# Patient Record
Sex: Male | Born: 1990 | Hispanic: Yes | Marital: Single | Smoking: Never smoker
Health system: Southern US, Community
[De-identification: ages and names within clinical notes are randomized; demographics above are authoritative.]

## PROBLEM LIST (undated history)

## (undated) DIAGNOSIS — R945 Abnormal results of liver function studies: Secondary | ICD-10-CM

## (undated) DIAGNOSIS — E119 Type 2 diabetes mellitus without complications: Secondary | ICD-10-CM

## (undated) DIAGNOSIS — K760 Fatty (change of) liver, not elsewhere classified: Secondary | ICD-10-CM

## (undated) HISTORY — DX: Abnormal results of liver function studies: R94.5

## (undated) HISTORY — DX: Type 2 diabetes mellitus without complications: E11.9

## (undated) HISTORY — PX: NO PAST SURGERIES: SHX2092

---
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
## 2018-11-13 ENCOUNTER — Ambulatory Visit (INDEPENDENT_AMBULATORY_CARE_PROVIDER_SITE_OTHER): Payer: BLUE CROSS/BLUE SHIELD | Admitting: Family Medicine

## 2018-11-13 ENCOUNTER — Encounter: Payer: Self-pay | Admitting: Family Medicine

## 2018-11-13 VITALS — BP 120/72 | HR 84 | Temp 98.4°F | Ht 67.0 in | Wt 175.6 lb

## 2018-11-13 DIAGNOSIS — Z7185 Encounter for immunization safety counseling: Secondary | ICD-10-CM

## 2018-11-13 DIAGNOSIS — Z113 Encounter for screening for infections with a predominantly sexual mode of transmission: Secondary | ICD-10-CM

## 2018-11-13 DIAGNOSIS — Z13220 Encounter for screening for lipoid disorders: Secondary | ICD-10-CM

## 2018-11-13 DIAGNOSIS — Z23 Encounter for immunization: Secondary | ICD-10-CM | POA: Diagnosis not present

## 2018-11-13 DIAGNOSIS — Z0000 Encounter for general adult medical examination without abnormal findings: Secondary | ICD-10-CM

## 2018-11-13 DIAGNOSIS — Z7189 Other specified counseling: Secondary | ICD-10-CM

## 2018-11-13 NOTE — Progress Notes (Signed)
Subjective:    Patient ID: Sean Gonzalez, male    DOB: 07/28/1991, 28 y.o.   MRN: [id number]  HPI Chief Complaint  Patient presents with  . Annual Exam   He is new to the practice and here for a complete physical exam. Previous medical care: No PCP. Goes to urgent care.  Last CPE:  5-6 years ago.   Other providers:  None   Social history: Lives with his parents. In a relationship with a male partner. No children. 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  Denies smoking, drinking alcohol, drug use Diet: fairly healthy  Exercise: 2 days per week.   Requests STD testing today.  Immunizations: flu shot- refuses. Tdap unknown.  Denies having HPV vaccine but would like to do some research about this.  Health maintenance:  Colonoscopy: N/A Last PSA: N/A Last Dental Exam: twice annually  Last Eye Exam: 4 months ago   Wears seatbelt always, smoke detectors in home and functioning, does not text while driving, feels safe in home environment.  Reviewed allergies, medications, past medical, surgical, family, and social history.    Review of Systems Review of Systems Constitutional: -fever, -chills, -sweats, -unexpected weight change,-fatigue ENT: -runny nose, -ear pain, -sore throat Cardiology:  -chest pain, -palpitations, -edema Respiratory: -cough, -shortness of breath, -wheezing Gastroenterology: -abdominal pain, -nausea, -vomiting, -diarrhea, -constipation  Hematology: -bleeding or bruising problems Musculoskeletal: -arthralgias, -myalgias, -joint swelling, -back pain Ophthalmology: -vision changes Urology: -dysuria, -difficulty urinating, -hematuria, -urinary frequency, -urgency Neurology: -headache, -weakness, -tingling, -numbness       Objective:   Physical Exam  BP 120/72   Pulse 84   Temp 98.4 F (36.9 C) (Oral)   Ht 5\' 7"  (1.702 m)   Wt 175 lb 9.6 oz (79.7 kg)   SpO2 98%   BMI 27.50 kg/m   General Appearance:    Alert, cooperative, no  distress, appears stated age  Head:    Normocephalic, without obvious abnormality, atraumatic  Eyes:    PERRL, conjunctiva/corneas clear, EOM's intact, fundi    benign  Ears:    Normal TM's and external ear canals  Nose:   Nares normal, mucosa normal, no drainage or sinus   tenderness  Throat:   Lips, mucosa, and tongue normal; teeth and gums normal  Neck:   Supple, no lymphadenopathy;  thyroid:  no   enlargement/tenderness/nodules; no carotid   bruit or JVD  Back:    Spine nontender, no curvature, ROM normal, no CVA     tenderness  Lungs:     Clear to auscultation bilaterally without wheezes, rales or     ronchi; respirations unlabored  Chest Wall:    No tenderness or deformity   Heart:    Regular rate and rhythm, S1 and S2 normal, no murmur, rub   or gallop  Breast Exam:    No chest wall tenderness, masses or gynecomastia  Abdomen:     Soft, non-tender, nondistended, normoactive bowel sounds,    no masses, no hepatosplenomegaly  Genitalia:    Normal male external genitalia without lesions.  Testicles without masses.  No inguinal hernias.  Rectal:   Deferred due to age <40 and lack of symptoms  Extremities:   No clubbing, cyanosis or edema  Pulses:   2+ and symmetric all extremities  Skin:   Skin color, texture, turgor normal, no rashes or lesions  Lymph nodes:   Cervical, supraclavicular, and axillary nodes normal  Neurologic:   CNII-XII intact, normal strength, sensation and gait;  reflexes 2+ and symmetric throughout          Psych:   Normal mood, affect, hygiene and grooming.     Urinalysis dipstick: Unable to leave specimen      Assessment & Plan:  Routine general medical examination at a health care facility - Plan: CBC with Differential/Platelet, Comprehensive metabolic panel, Lipid panel, CANCELED: POCT Urinalysis DIP (Proadvantage Device)  Need for diphtheria-tetanus-pertussis (Tdap) vaccine - Plan: Tdap vaccine greater than or equal to 7yo IM  Immunization counseling -  Plan: Tdap vaccine greater than or equal to 7yo IM  Screening for lipid disorders - Plan: Lipid panel  Screen for STD (sexually transmitted disease) - Plan: HIV Antibody (routine testing w rflx), RPR, CANCELED: GC/Chlamydia Probe Amp  He is a pleasant 28 year old male who is new to the practice and here today for a fasting CPE. Appears to be taking good care of himself. Counseling on healthy diet and exercise for continued health. Immunization counseling was done.  He will check on the HPV vaccine and let us know if he decides to get this.  He declines flu shot.  Tdap was given and counseling done on all components of the vaccine. Discussed safety and health promotion Recommend monthly self testicular exams. STD screening done per patient request.  He was unable to leave a urine specimen so we will have to cancel the GC/CT. Follow-up pending lab results.

## 2018-11-13 NOTE — Patient Instructions (Signed)
It was a pleasure meeting you today.  You received your Tdap (tetanus, diphtheria, pertussis) vaccine today and this is good for 10 years. You can call and check with your insurance company regarding the HPV vaccine.  This is a 3 shot series over 6 months.  If you decide to get this you can call and schedule a nurse visit.  Make sure you are eating a well-balanced, healthy diet and getting at least 150 minutes of physical activity per week.  As discussed, make sure you are doing monthly testicular exams.  We will call you with your lab results.    Preventive Care 18-39 Years, Male Preventive care refers to lifestyle choices and visits with your health care provider that can promote health and wellness. What does preventive care include?   A yearly physical exam. This is also called an annual well check.  Dental exams once or twice a year.  Routine eye exams. Ask your health care provider how often you should have your eyes checked.  Personal lifestyle choices, including: ? Daily care of your teeth and gums. ? Regular physical activity. ? Eating a healthy diet. ? Avoiding tobacco and drug use. ? Limiting alcohol use. ? Practicing safe sex. What happens during an annual well check? The services and screenings done by your health care provider during your annual well check will depend on your age, overall health, lifestyle risk factors, and family history of disease. Counseling Your health care provider may ask you questions about your:  Alcohol use.  Tobacco use.  Drug use.  Emotional well-being.  Home and relationship well-being.  Sexual activity.  Eating habits.  Work and work Statistician. Screening You may have the following tests or measurements:  Height, weight, and BMI.  Blood pressure.  Lipid and cholesterol levels. These may be checked every 5 years starting at age 46.  Diabetes screening. This is done by checking your blood sugar (glucose) after you  have not eaten for a while (fasting).  Skin check.  Hepatitis C blood test.  Hepatitis B blood test.  Sexually transmitted disease (STD) testing. Discuss your test results, treatment options, and if necessary, the need for more tests with your health care provider. Vaccines Your health care provider may recommend certain vaccines, such as:  Influenza vaccine. This is recommended every year.  Tetanus, diphtheria, and acellular pertussis (Tdap, Td) vaccine. You may need a Td booster every 10 years.  Varicella vaccine. You may need this if you have not been vaccinated.  HPV vaccine. If you are 97 or younger, you may need three doses over 6 months.  Measles, mumps, and rubella (MMR) vaccine. You may need at least one dose of MMR.You may also need a second dose.  Pneumococcal 13-valent conjugate (PCV13) vaccine. You may need this if you have certain conditions and have not been vaccinated.  Pneumococcal polysaccharide (PPSV23) vaccine. You may need one or two doses if you smoke cigarettes or if you have certain conditions.  Meningococcal vaccine. One dose is recommended if you are age 57-21 years and a first-year college student living in a residence hall, or if you have one of several medical conditions. You may also need additional booster doses.  Hepatitis A vaccine. You may need this if you have certain conditions or if you travel or work in places where you may be exposed to hepatitis A.  Hepatitis B vaccine. You may need this if you have certain conditions or if you travel or work in places where  you may be exposed to hepatitis B.  Haemophilus influenzae type b (Hib) vaccine. You may need this if you have certain risk factors. Talk to your health care provider about which screenings and vaccines you need and how often you need them. This information is not intended to replace advice given to you by your health care provider. Make sure you discuss any questions you have with your  health care provider. Document Released: 12/14/2001 Document Revised: 05/31/2017 Document Reviewed: 08/19/2015 Elsevier Interactive Patient Education  2019 [employer].

## 2018-11-14 LAB — LIPID PANEL
Chol/HDL Ratio: 5.1 ratio — ABNORMAL HIGH (ref 0.0–5.0)
Cholesterol, Total: 295 mg/dL — ABNORMAL HIGH (ref 100–199)
HDL: 58 mg/dL (ref >39)
LDL Calculated: 178 mg/dL — ABNORMAL HIGH (ref 0–99)
Triglycerides: 297 mg/dL — ABNORMAL HIGH (ref 0–149)
VLDL Cholesterol Cal: 59 mg/dL — ABNORMAL HIGH (ref 5–40)

## 2018-11-14 LAB — CBC WITH DIFFERENTIAL/PLATELET
Basophils Absolute: 0.1 10*3/uL (ref 0.0–0.2)
Basos: 1 %
EOS (ABSOLUTE): 0.1 10*3/uL (ref 0.0–0.4)
Eos: 2 %
Hematocrit: 48.5 % (ref 37.5–51.0)
Hemoglobin: 17.4 g/dL (ref 13.0–17.7)
Immature Grans (Abs): 0 10*3/uL (ref 0.0–0.1)
Immature Granulocytes: 1 %
LYMPHS ABS: 2.2 10*3/uL (ref 0.7–3.1)
Lymphs: 35 %
MCH: 31.8 pg (ref 26.6–33.0)
MCHC: 35.9 g/dL — ABNORMAL HIGH (ref 31.5–35.7)
MCV: 89 fL (ref 79–97)
Monocytes Absolute: 0.4 10*3/uL (ref 0.1–0.9)
Monocytes: 7 %
Neutrophils Absolute: 3.5 10*3/uL (ref 1.4–7.0)
Neutrophils: 54 %
PLATELETS: 226 10*3/uL (ref 150–450)
RBC: 5.48 x10E6/uL (ref 4.14–5.80)
RDW: 12.9 % (ref 11.6–15.4)
WBC: 6.4 10*3/uL (ref 3.4–10.8)

## 2018-11-14 LAB — COMPREHENSIVE METABOLIC PANEL
ALT: 179 IU/L — AB (ref 0–44)
AST: 70 IU/L — ABNORMAL HIGH (ref 0–40)
Albumin/Globulin Ratio: 1.8 (ref 1.2–2.2)
Albumin: 4.9 g/dL (ref 3.5–5.5)
Alkaline Phosphatase: 187 IU/L — ABNORMAL HIGH (ref 39–117)
BUN/Creatinine Ratio: 20 (ref 9–20)
BUN: 15 mg/dL (ref 6–20)
Bilirubin Total: 1.6 mg/dL — ABNORMAL HIGH (ref 0.0–1.2)
CO2: 25 mmol/L (ref 20–29)
Calcium: 10 mg/dL (ref 8.7–10.2)
Chloride: 94 mmol/L — ABNORMAL LOW (ref 96–106)
Creatinine, Ser: 0.74 mg/dL — ABNORMAL LOW (ref 0.76–1.27)
GFR calc Af Amer: 146 mL/min/{1.73_m2} (ref >59)
GFR calc non Af Amer: 126 mL/min/{1.73_m2} (ref >59)
Globulin, Total: 2.7 g/dL (ref 1.5–4.5)
Glucose: 328 mg/dL — ABNORMAL HIGH (ref 65–99)
Potassium: 3.9 mmol/L (ref 3.5–5.2)
Sodium: 138 mmol/L (ref 134–144)
Total Protein: 7.6 g/dL (ref 6.0–8.5)

## 2018-11-14 LAB — HIV ANTIBODY (ROUTINE TESTING W REFLEX): HIV Screen 4th Generation wRfx: NONREACTIVE

## 2018-11-14 LAB — RPR: RPR Ser Ql: NONREACTIVE

## 2018-11-21 ENCOUNTER — Encounter: Payer: Self-pay | Admitting: Family Medicine

## 2018-11-21 ENCOUNTER — Ambulatory Visit (INDEPENDENT_AMBULATORY_CARE_PROVIDER_SITE_OTHER): Payer: BLUE CROSS/BLUE SHIELD | Admitting: Family Medicine

## 2018-11-21 VITALS — BP 120/74 | HR 85 | Wt 177.4 lb

## 2018-11-21 DIAGNOSIS — E1169 Type 2 diabetes mellitus with other specified complication: Secondary | ICD-10-CM

## 2018-11-21 DIAGNOSIS — R945 Abnormal results of liver function studies: Secondary | ICD-10-CM

## 2018-11-21 DIAGNOSIS — E119 Type 2 diabetes mellitus without complications: Secondary | ICD-10-CM | POA: Diagnosis not present

## 2018-11-21 DIAGNOSIS — R7309 Other abnormal glucose: Secondary | ICD-10-CM | POA: Diagnosis not present

## 2018-11-21 DIAGNOSIS — R7989 Other specified abnormal findings of blood chemistry: Secondary | ICD-10-CM

## 2018-11-21 DIAGNOSIS — E785 Hyperlipidemia, unspecified: Secondary | ICD-10-CM

## 2018-11-21 LAB — POCT URINALYSIS DIP (PROADVANTAGE DEVICE)
Bilirubin, UA: NEGATIVE
Blood, UA: NEGATIVE
GLUCOSE UA: NEGATIVE mg/dL
Nitrite, UA: NEGATIVE
Protein Ur, POC: NEGATIVE mg/dL
Specific Gravity, Urine: 1.025
Urobilinogen, Ur: NEGATIVE
pH, UA: 6 (ref 5.0–8.0)

## 2018-11-21 LAB — POCT GLYCOSYLATED HEMOGLOBIN (HGB A1C): Hemoglobin A1C: 14 % — AB (ref 4.0–5.6)

## 2018-11-21 MED ORDER — INSULIN GLARGINE (1 UNIT DIAL) 300 UNIT/ML ~~LOC~~ SOPN: 10.0000 [IU] | PEN_INJECTOR | Freq: Every day | SUBCUTANEOUS | 0 refills | Status: DC

## 2018-11-21 MED ORDER — GLUCOSE BLOOD VI STRP: ORAL_STRIP | 2 refills | Status: DC

## 2018-11-21 MED ORDER — ONETOUCH DELICA LANCETS 33G MISC: 1 refills | Status: DC

## 2018-11-21 MED ORDER — METFORMIN HCL 500 MG PO TABS: 500.0000 mg | ORAL_TABLET | Freq: Two times a day (BID) | ORAL | 2 refills | Status: DC

## 2018-11-21 NOTE — Patient Instructions (Addendum)
Go to the American Diabetes Association website to read about diabetes.   Start on the metformin twice daily with meals as prescribed.   Take 10 units of Toujeo (long acting insulin) around the same time each day. This is once daily.   Check your blood sugars every day when you wake up (this is fasting) and then 2 hours after lunch or supper. No other times.   Goal fasting blood sugar is 80-130 and 2 hours after a meal 130-160.  It will take some time to get your blood sugars down to this so do not worry if your blood sugars are not close to goal for the next couple of weeks.   Cut back on carbohydrates (potaotes, rice, pasta, bread) and sugar.  Try to limit your carbohydrates to 45-60 per meal and 15 for a snack.   Avoid alcohol and any over the counter pain medications such as ibuprofen, aleve, aspirin etc.   Get at least 150 minutes of physical activity per week. Start slow and increase your activity.      Diabetes Mellitus and Standards of Medical Care Managing diabetes (diabetes mellitus) can be complicated. Your diabetes treatment may be managed by a team of health care providers, including:  A physician who specializes in diabetes (endocrinologist).  A nurse practitioner or physician assistant.  Nurses.  A diet and nutrition specialist (registered dietitian).  A certified diabetes educator (CDE).  An exercise specialist.  A pharmacist.  An eye doctor.  A foot specialist (podiatrist).  A dentist.  A primary care provider.  A mental health provider. Your health care providers follow guidelines to help you get the best quality of care. The following schedule is a general guideline for your diabetes management plan. Your health care providers may give you more specific instructions. Physical exams Upon being diagnosed with diabetes mellitus, and each year after that, your health care provider will ask about your medical and family history. He or she will also do a  physical exam. Your exam may include:  Measuring your height, weight, and body mass index (BMI).  Checking your blood pressure. This will be done at every routine medical visit. Your target blood pressure may vary depending on your medical conditions, your age, and other factors.  Thyroid gland exam.  Skin exam.  Screening for damage to your nerves (peripheral neuropathy). This may include checking the pulse in your legs and feet and checking the level of sensation in your hands and feet.  A complete foot exam to inspect the structure and skin of your feet, including checking for cuts, bruises, redness, blisters, sores, or other problems.  Screening for blood vessel (vascular) problems, which may include checking the pulse in your legs and feet and checking your temperature. Blood tests Depending on your treatment plan and your personal needs, you may have the following tests done:  HbA1c (hemoglobin A1c). This test provides information about blood sugar (glucose) control over the previous 2-3 months. It is used to adjust your treatment plan, if needed. This test will be done: ? At least 2 times a year, if you are meeting your treatment goals. ? 4 times a year, if you are not meeting your treatment goals or if treatment goals have changed.  Lipid testing, including total, LDL, and HDL cholesterol and triglyceride levels. ? The goal for LDL is less than 100 mg/dL (5.5 mmol/L). If you are at high risk for complications, the goal is less than 70 mg/dL (3.9 mmol/L). ? The  goal for HDL is 40 mg/dL (2.2 mmol/L) or higher for men and 50 mg/dL (2.8 mmol/L) or higher for women. An HDL cholesterol of 60 mg/dL (3.3 mmol/L) or higher gives some protection against heart disease. ? The goal for triglycerides is less than 150 mg/dL (8.3 mmol/L).  Liver function tests.  Kidney function tests.  Thyroid function tests. Dental and eye exams  Visit your dentist two times a year.  If you have type 1  diabetes, your health care provider may recommend an eye exam 3-5 years after you are diagnosed, and then once a year after your first exam. ? For children with type 1 diabetes, a health care provider may recommend an eye exam when your child is age 33 or older and has had diabetes for 3-5 years. After the first exam, your child should get an eye exam once a year.  If you have type 2 diabetes, your health care provider may recommend an eye exam as soon as you are diagnosed, and then once a year after your first exam. Immunizations   The yearly flu (influenza) vaccine is recommended for everyone 6 months or older who has diabetes.  The pneumonia (pneumococcal) vaccine is recommended for everyone 2 years or older who has diabetes. If you are 69 or older, you may get the pneumonia vaccine as a series of two separate shots.  The hepatitis B vaccine is recommended for adults shortly after being diagnosed with diabetes.  Adults and children with diabetes should receive all other vaccines according to age-specific recommendations from the Centers for Disease Control and Prevention (CDC). Mental and emotional health Screening for symptoms of eating disorders, anxiety, and depression is recommended at the time of diagnosis and afterward as needed. If your screening shows that you have symptoms (positive screening result), you may need more evaluation and you may work with a mental health care provider. Treatment plan Your treatment plan will be reviewed at every medical visit. You and your health care provider will discuss:  How you are taking your medicines, including insulin.  Any side effects you are experiencing.  Your blood glucose target goals.  The frequency of your blood glucose monitoring.  Lifestyle habits, such as activity level as well as tobacco, alcohol, and substance use. Diabetes self-management education Your health care provider will assess how well you are monitoring your blood  glucose levels and whether you are taking your insulin correctly. He or she may refer you to:  A certified diabetes educator to manage your diabetes throughout your life, starting at diagnosis.  A registered dietitian who can create or review your personal nutrition plan.  An exercise specialist who can discuss your activity level and exercise plan. Summary  Managing diabetes (diabetes mellitus) can be complicated. Your diabetes treatment may be managed by a team of health care providers.  Your health care providers follow guidelines in order to help you get the best quality of care.  Standards of care including having regular physical exams, blood tests, blood pressure monitoring, immunizations, screening tests, and education about how to manage your diabetes.  Your health care providers may also give you more specific instructions based on your individual health. This information is not intended to replace advice given to you by your health care provider. Make sure you discuss any questions you have with your health care provider. Document Released: 08/15/2009 Document Revised: 07/07/2018 Document Reviewed: 07/16/2016 Elsevier Interactive Patient Education  2019 [employer].

## 2018-11-21 NOTE — Progress Notes (Signed)
   Subjective:    Patient ID: Sean Gonzalez, male    DOB: 09-03-1991, 28 y.o.   MRN: [id number]  HPI Chief Complaint  Patient presents with  . discuss labs    discuss labs   He is here to follow up on abnormal labs.  In his previous visit he was here for a new patient physical.  His fasting glucose was greater than 300 with no known history of diabetes. States that prior to our last visit he had not had medical care in at least 8 years.  He does have a family history of diabetes in his mother.   Complains of urinary frequency, increased thirst when specifically asked however he declined any symptoms at his visit last week.  He also had significantly elevated liver function tests.  Diet is high in carbohydrates. Eats fast food several times per week Exercising once a week.   Reports he rarely drinks alcohol or uses over-the-counter anti-inflammatories.  Elevated LDL.  Denies fever, chills, night sweats, dizziness, unexplained weight changes, chest pain, palpitations, shortness of breath, cough, abdominal pain, nausea, vomiting, diarrhea.    Review of Systems Pertinent positives and negatives in the history of present illness.     Objective:   Physical Exam BP 120/74   Pulse 85   Wt 177 lb 6.4 oz (80.5 kg)   BMI 27.78 kg/m   Alert and oriented and in no acute distress.  Not otherwise examined.      Assessment & Plan:  Diabetes mellitus, new onset (HCC) - Plan: CBC with Differential/Platelet, Comprehensive metabolic panel, TSH, T4, free, T3, Insulin and C-Peptide, metFORMIN (GLUCOPHAGE) 500 MG tablet, Insulin Glargine, 1 Unit Dial, (TOUJEO SOLOSTAR) 300 UNIT/ML SOPN, POCT Urinalysis DIP (Proadvantage Device)  Elevated glucose - Plan: HgB A1c  Elevated liver function tests - Plan: Comprehensive metabolic panel, Protime-INR, Acute Hep Panel & Hep B Surface Ab  Hyperlipidemia associated with type 2 diabetes mellitus (HCC)  Hemoglobin A1c 14%.  In-depth  counseling on diabetes diagnosis, diet and lifestyle modifications to help control diabetes.  He was provided with a meter and testing supplies and instructed on checking his blood sugars.  I encouraged him to do this twice daily for the next week until I see him back. We will start him on metformin twice daily and a long-acting insulin.  Samples of Toujeo were available so we started with 10 units and 2 pens were provided to the patient.  He was taught how to give himself these injections and the first dose was given in the office.  He will do these once daily around the same time. Encouraged him to go to the ADA website to read about standards of care for diabetes. He does appear to be overwhelmed today and he is tearful.  He lives with his parents and has a good support network. Discussed that I am also concerned about his liver function tests and we will repeat these today along with adding an acute hepatitis panel.  Advised him to avoid alcohol, NSAIDs.  If his liver function tests are not much improved, he is aware that I will order an ultrasound to look at his liver.  I will also refer him to GI at that point. Plan to bring him back next Monday to see how he is doing, answer any questions and at that point we will need to discuss adding a statin.

## 2018-11-22 ENCOUNTER — Other Ambulatory Visit: Payer: Self-pay | Admitting: Internal Medicine

## 2018-11-22 DIAGNOSIS — R748 Abnormal levels of other serum enzymes: Secondary | ICD-10-CM

## 2018-11-22 LAB — TSH: TSH: 1.2 u[IU]/mL (ref 0.450–4.500)

## 2018-11-22 LAB — CBC WITH DIFFERENTIAL/PLATELET
Basophils Absolute: 0.1 10*3/uL (ref 0.0–0.2)
Basos: 1 %
EOS (ABSOLUTE): 0.1 10*3/uL (ref 0.0–0.4)
Eos: 2 %
Hematocrit: 47 % (ref 37.5–51.0)
Hemoglobin: 16.6 g/dL (ref 13.0–17.7)
Immature Grans (Abs): 0.1 10*3/uL (ref 0.0–0.1)
Immature Granulocytes: 1 %
Lymphocytes Absolute: 2.4 10*3/uL (ref 0.7–3.1)
Lymphs: 32 %
MCH: 30.5 pg (ref 26.6–33.0)
MCHC: 35.3 g/dL (ref 31.5–35.7)
MCV: 86 fL (ref 79–97)
Monocytes Absolute: 0.4 10*3/uL (ref 0.1–0.9)
Monocytes: 6 %
NEUTROS ABS: 4.3 10*3/uL (ref 1.4–7.0)
Neutrophils: 58 %
PLATELETS: 247 10*3/uL (ref 150–450)
RBC: 5.44 x10E6/uL (ref 4.14–5.80)
RDW: 12.9 % (ref 11.6–15.4)
WBC: 7.3 10*3/uL (ref 3.4–10.8)

## 2018-11-22 LAB — COMPREHENSIVE METABOLIC PANEL
ALT: 135 IU/L — AB (ref 0–44)
AST: 42 IU/L — ABNORMAL HIGH (ref 0–40)
Albumin/Globulin Ratio: 1.7 (ref 1.2–2.2)
Albumin: 4.6 g/dL (ref 4.1–5.2)
Alkaline Phosphatase: 202 IU/L — ABNORMAL HIGH (ref 39–117)
BUN/Creatinine Ratio: 20 (ref 9–20)
BUN: 13 mg/dL (ref 6–20)
Bilirubin Total: 0.8 mg/dL (ref 0.0–1.2)
CO2: 24 mmol/L (ref 20–29)
Calcium: 9.6 mg/dL (ref 8.7–10.2)
Chloride: 95 mmol/L — ABNORMAL LOW (ref 96–106)
Creatinine, Ser: 0.65 mg/dL — ABNORMAL LOW (ref 0.76–1.27)
GFR calc Af Amer: 154 mL/min/{1.73_m2} (ref >59)
GFR calc non Af Amer: 133 mL/min/{1.73_m2} (ref >59)
GLUCOSE: 323 mg/dL — AB (ref 65–99)
Globulin, Total: 2.7 g/dL (ref 1.5–4.5)
Potassium: 4.3 mmol/L (ref 3.5–5.2)
Sodium: 138 mmol/L (ref 134–144)
Total Protein: 7.3 g/dL (ref 6.0–8.5)

## 2018-11-22 LAB — INSULIN AND C-PEPTIDE, SERUM
C-Peptide: 2.3 ng/mL (ref 1.1–4.4)
INSULIN: 14.8 u[IU]/mL (ref 2.6–24.9)

## 2018-11-22 LAB — T4, FREE: Free T4: 1.35 ng/dL (ref 0.82–1.77)

## 2018-11-22 LAB — PROTIME-INR
INR: 1 (ref 0.8–1.2)
Prothrombin Time: 10.4 s (ref 9.1–12.0)

## 2018-11-22 LAB — T3: T3, Total: 106 ng/dL (ref 71–180)

## 2018-11-23 LAB — ACUTE HEP PANEL AND HEP B SURFACE AB
HEP A IGM: NEGATIVE
Hep B C IgM: NEGATIVE
Hep C Virus Ab: <0.1 s/co ratio (ref 0.0–0.9)
Hepatitis B Surf Ab Quant: 146.6 m[IU]/mL (ref >9.9)
Hepatitis B Surface Ag: NEGATIVE

## 2018-11-27 ENCOUNTER — Encounter: Payer: Self-pay | Admitting: Family Medicine

## 2018-11-27 ENCOUNTER — Other Ambulatory Visit: Payer: Self-pay

## 2018-11-27 ENCOUNTER — Encounter: Payer: Self-pay | Admitting: Physician Assistant

## 2018-11-27 ENCOUNTER — Ambulatory Visit (INDEPENDENT_AMBULATORY_CARE_PROVIDER_SITE_OTHER): Payer: BLUE CROSS/BLUE SHIELD | Admitting: Family Medicine

## 2018-11-27 VITALS — BP 124/88 | HR 88 | Wt 176.6 lb

## 2018-11-27 DIAGNOSIS — R7989 Other specified abnormal findings of blood chemistry: Secondary | ICD-10-CM

## 2018-11-27 DIAGNOSIS — R822 Biliuria: Secondary | ICD-10-CM

## 2018-11-27 DIAGNOSIS — E119 Type 2 diabetes mellitus without complications: Secondary | ICD-10-CM

## 2018-11-27 DIAGNOSIS — R945 Abnormal results of liver function studies: Secondary | ICD-10-CM

## 2018-11-27 HISTORY — DX: Type 2 diabetes mellitus without complications: E11.9

## 2018-11-27 HISTORY — DX: Other specified abnormal findings of blood chemistry: R79.89

## 2018-11-27 MED ORDER — DAPAGLIFLOZIN PROPANEDIOL 5 MG PO TABS: 5.0000 mg | ORAL_TABLET | Freq: Every day | ORAL | 2 refills | Status: DC

## 2018-11-27 MED ORDER — INSULIN GLARGINE (1 UNIT DIAL) 300 UNIT/ML ~~LOC~~ SOPN: 14.0000 [IU] | PEN_INJECTOR | Freq: Every day | SUBCUTANEOUS | 0 refills | Status: DC

## 2018-11-27 NOTE — Patient Instructions (Signed)
We are increasing your Toujeo insulin dose to 14 units daily.   Take the [place] as prescribed once daily.   Continue on the Metformin for now and let's see how you do. If your symptoms are not improving then let me know.   I am referring you to the medical nutritionist and they will call you to schedule.   Go for your abdominal US as scheduled. I am also referring you to the gastroenterologist. They will call you to schedule.   Check your blood sugars as discussed twice daily for the next 2 weeks and return to see me.

## 2018-11-27 NOTE — Progress Notes (Signed)
   Subjective:    Patient ID: Sean Gonzalez, male    DOB: 02-05-1991, 28 y.o.   MRN: [id number]  HPI Chief Complaint  Patient presents with  . new    diabetes follow-up, metformin- causing headaches and diarrhea   He is here to follow up on new diabetes diagnosis. His girlfriend who is a Associate Professor is with him today.   States he feels fine. No longer having increased thirst or urinary frequency.   States he could not figure out how to correctly check his BS at home. States he has not been reading about diabetes. States he is still a little overwhelmed and hopes to be able to stop diabetes medication in the future.   Reports taking Toujeo 10 units daily at 11 am and Metformin 500 mg twice daily at 1 pm and 7 pm due to his work schedule.  Reports working 3-12 pm.  Complains of mild headache and diarrhea.   States he has made healthy diet changes but still eat rice and bread.  States in the morning he is now having a grilled chicken sandwich and for supper grilled chicken and veggies.  Does not eat breakfast.    Liver US is this Thursday due to elevated LFTs. Denies alcohol, drug or NSAID use.   Denies fever, chills, dizziness, chest pain, palpitations, shortness of breath, abdominal pain, N/V, urinary symptoms, LE edema.   Reviewed allergies, medications, past medical, surgical, family, and social history.   Review of Systems Pertinent positives and negatives in the history of present illness.     Objective:   Physical Exam Constitutional:      General: He is not in acute distress.    Appearance: Normal appearance.  Eyes:     Conjunctiva/sclera: Conjunctivae normal.  Skin:    General: Skin is warm and dry.     Coloration: Skin is not jaundiced or pale.     Findings: No rash.  Neurological:     Mental Status: He is alert.    BP 124/88   Pulse 88   Wt 176 lb 9.6 oz (80.1 kg)   BMI 27.66 kg/m       Assessment & Plan:  Diabetes mellitus, new onset (HCC) -  Plan: Comprehensive metabolic panel, Amb ref to Medical Nutrition Therapy-MNT  Elevated liver function tests - Plan: Comprehensive metabolic panel, Ambulatory referral to Gastroenterology  Bilirubin in urine - Plan: POCT Urinalysis DIP (Proadvantage Device)  PCOT 307 - taken with his glucometer in order to teach him how to use this.  Increase Toujeo to 14 units daily.  Add Farxiga 5 mg daily.  Continue on Metformin 500 mg bid. He will let me know if headache and diarrhea worsening or not improving.  Advised to check BS twice daily.  Referral to MNT. Counseling on standards of care of diabetes. He does not want to start on a statin now. Prefers to go slow with treatment. He does admit that he is still somewhat overwhelmed with his diagnosis.  Liver function tests elevated due to unknown etiology. Liver US scheduled for this Thursday per patient. Referral to GI for further evaluation.  States he cannot leave a urine specimen.  Follow up in 2 weeks and bring in his readings.

## 2018-11-28 LAB — COMPREHENSIVE METABOLIC PANEL
ALT: 142 IU/L — ABNORMAL HIGH (ref 0–44)
AST: 47 IU/L — ABNORMAL HIGH (ref 0–40)
Albumin/Globulin Ratio: 1.8 (ref 1.2–2.2)
Albumin: 4.5 g/dL (ref 4.1–5.2)
Alkaline Phosphatase: 183 IU/L — ABNORMAL HIGH (ref 39–117)
BUN/Creatinine Ratio: 21 — ABNORMAL HIGH (ref 9–20)
BUN: 12 mg/dL (ref 6–20)
Bilirubin Total: 0.8 mg/dL (ref 0.0–1.2)
CO2: 22 mmol/L (ref 20–29)
Calcium: 9.6 mg/dL (ref 8.7–10.2)
Chloride: 98 mmol/L (ref 96–106)
Creatinine, Ser: 0.58 mg/dL — ABNORMAL LOW (ref 0.76–1.27)
GFR calc Af Amer: 162 mL/min/{1.73_m2} (ref >59)
GFR calc non Af Amer: 140 mL/min/{1.73_m2} (ref >59)
GLOBULIN, TOTAL: 2.5 g/dL (ref 1.5–4.5)
Glucose: 277 mg/dL — ABNORMAL HIGH (ref 65–99)
POTASSIUM: 4.1 mmol/L (ref 3.5–5.2)
SODIUM: 136 mmol/L (ref 134–144)
Total Protein: 7 g/dL (ref 6.0–8.5)

## 2018-11-30 ENCOUNTER — Ambulatory Visit
Admission: RE | Admit: 2018-11-30 | Discharge: 2018-11-30 | Disposition: A | Discharge status: Home / Self Care | Payer: BLUE CROSS/BLUE SHIELD | Source: Ambulatory Visit | Attending: Family Medicine | Admitting: Family Medicine

## 2018-11-30 ENCOUNTER — Encounter: Payer: Self-pay | Admitting: Internal Medicine

## 2018-11-30 DIAGNOSIS — K760 Fatty (change of) liver, not elsewhere classified: Secondary | ICD-10-CM | POA: Diagnosis not present

## 2018-11-30 DIAGNOSIS — R748 Abnormal levels of other serum enzymes: Secondary | ICD-10-CM

## 2018-12-04 ENCOUNTER — Ambulatory Visit: Payer: BLUE CROSS/BLUE SHIELD | Admitting: Dietician

## 2018-12-04 ENCOUNTER — Encounter: Payer: Self-pay | Admitting: Physician Assistant

## 2018-12-04 ENCOUNTER — Ambulatory Visit (INDEPENDENT_AMBULATORY_CARE_PROVIDER_SITE_OTHER): Payer: BLUE CROSS/BLUE SHIELD | Admitting: Physician Assistant

## 2018-12-04 ENCOUNTER — Other Ambulatory Visit (INDEPENDENT_AMBULATORY_CARE_PROVIDER_SITE_OTHER): Payer: BLUE CROSS/BLUE SHIELD

## 2018-12-04 VITALS — BP 130/78 | HR 84 | Ht 67.0 in | Wt 177.4 lb

## 2018-12-04 DIAGNOSIS — R945 Abnormal results of liver function studies: Secondary | ICD-10-CM

## 2018-12-04 DIAGNOSIS — K760 Fatty (change of) liver, not elsewhere classified: Secondary | ICD-10-CM | POA: Diagnosis not present

## 2018-12-04 DIAGNOSIS — R7989 Other specified abnormal findings of blood chemistry: Secondary | ICD-10-CM

## 2018-12-04 LAB — FERRITIN: Ferritin: 154.7 ng/mL (ref 22.0–322.0)

## 2018-12-04 LAB — IGA: IgA: 308 mg/dL (ref 68–378)

## 2018-12-04 LAB — IBC PANEL
Iron: 79 ug/dL (ref 42–165)
Saturation Ratios: 16.9 % — ABNORMAL LOW (ref 20.0–50.0)
TRANSFERRIN: 333 mg/dL (ref 212.0–360.0)

## 2018-12-04 NOTE — Patient Instructions (Signed)
Your provider has requested that you go to the basement level for lab work before leaving today. Press "B" on the elevator. The lab is located at the first door on the left as you exit the elevator.   Please follow up with Sean Gonzalez in 4 weeks.

## 2018-12-04 NOTE — Progress Notes (Signed)
Chief Complaint: Elevated LFTs  HPI:    [name] is a 28 year old Hispanic male with a past medical history as listed below, who was referred to me by Girtha Rm, NP-C for a complaint of elevated liver function tests.      11/27/2018 CMP with an alk phos of 183 (202 on 11/13/2018), AST 47 (42 on 11/13/2018) and ALT 142 (135 on 11/13/2018).  Prior to this 3 weeks ago T bili elevated at 1.6, alk phos 187, AST 70, ALT 179.   11/21/2018 hep B acute panel today, PT/INR normal.    11/30/2018 right upper quadrant ultrasound with mild fatty infiltration of the liver no other acute findings.    Today, the patient presents clinic and explains that he is feeling well.  He was just diagnosed with diabetes about 3 to 4 weeks ago and started medication around that time.  This is the first time in his life he has ever been told he has elevated liver enzymes.  He denies any family history of liver disease, IV drug use, tattoos, supplement use or extreme use of Tylenol or alcohol.    Denies fever, chills, weight loss, pruritus, jaundice, heartburn, reflux, change in bowel habits or abdominal pain.  Past Medical History:  Diagnosis Date  . Diabetes mellitus, new onset ([place]) 11/27/2018  . Elevated liver function tests 11/27/2018    No past surgical history on file.  Current Outpatient Medications  Medication Sig Dispense Refill  . dapagliflozin propanediol (FARXIGA) 5 MG TABS tablet Take 5 mg by mouth daily. 30 tablet 2  . glucose blood (ONETOUCH VERIO) test strip Test twice a day. Pt uses onetouch verio flex meter 100 each 2  . Insulin Glargine, 1 Unit Dial, (TOUJEO SOLOSTAR) 300 UNIT/ML SOPN Inject 14 Units into the skin daily. 5 pen 0  . metFORMIN (GLUCOPHAGE) 500 MG tablet Take 1 tablet (500 mg total) by mouth 2 (two) times daily with a meal. 60 tablet 2  . ONETOUCH DELICA LANCETS 90W MISC Test twice a day. Pt uses onetouch verio flex meter 100 each 1   No current facility-administered medications  for this visit.     Allergies as of 12/04/2018  . (No Known Allergies)    Family History  Problem Relation Age of Onset  . Diabetes Mother     Social History   Socioeconomic History  . Marital status: Single    Spouse name: Not on file  . Number of children: Not on file  . Years of education: Not on file  . Highest education level: Not on file  Occupational History  . Not on file  Social Needs  . Financial resource strain: Not on file  . Food insecurity:    Worry: Not on file    Inability: Not on file  . Transportation needs:    Medical: Not on file    Non-medical: Not on file  Tobacco Use  . Smoking status: Never Smoker  . Smokeless tobacco: Never Used  Substance and Sexual Activity  . Alcohol use: Not Currently    Frequency: Never  . Drug use: Never  . Sexual activity: Yes  Lifestyle  . Physical activity:    Days per week: Not on file    Minutes per session: Not on file  . Stress: Not on file  Relationships  . Social connections:    Talks on phone: Not on file    Gets together: Not on file    Attends religious service: Not on  file    Active member of club or organization: Not on file    Attends meetings of clubs or organizations: Not on file    Relationship status: Not on file  . Intimate partner violence:    Fear of current or ex partner: Not on file    Emotionally abused: Not on file    Physically abused: Not on file    Forced sexual activity: Not on file  Other Topics Concern  . Not on file  Social History Narrative  . Not on file    Review of Systems:    Constitutional: No weight loss, fever or chills Skin: No rash  Cardiovascular: No chest pain Respiratory: No SOB  Gastrointestinal: See HPI and otherwise negative Genitourinary: No dysuria Neurological: No headache, dizziness or syncope Musculoskeletal: No new muscle or joint pain Hematologic: No bleeding  Psychiatric: No history of depression or anxiety   Physical Exam:  Vital  signs: BP 130/78 (BP Location: Left Arm, Patient Position: Sitting, Cuff Size: Normal)   Pulse 84   Ht '5\' 7"'$  (1.702 m) Comment: height measured without shoes  Wt 177 lb 6 oz (80.5 kg)   BMI 27.78 kg/m   Constitutional:   Pleasant Hispanic male appears to be in NAD, Well developed, Well nourished, alert and cooperative Head:  Normocephalic and atraumatic. Eyes:   PEERL, EOMI. No icterus. Conjunctiva pink. Ears:  Normal auditory acuity. Neck:  Supple Throat: Oral cavity and pharynx without inflammation, swelling or lesion.  Respiratory: Respirations even and unlabored. Lungs clear to auscultation bilaterally.   No wheezes, crackles, or rhonchi.  Cardiovascular: Normal S1, S2. No MRG. Regular rate and rhythm. No peripheral edema, cyanosis or pallor.  Gastrointestinal:  Soft, nondistended, nontender. No rebound or guarding. Normal bowel sounds. No appreciable masses or hepatomegaly. Rectal:  Not performed.  Msk:  Symmetrical without gross deformities. Without edema, no deformity or joint abnormality.  Neurologic:  Alert and  oriented x4;  grossly normal neurologically.  Skin:   Dry and intact without significant lesions or rashes. Psychiatric: Demonstrates good judgement and reason without abnormal affect or behaviors.  MOST RECENT LABS AND IMAGING: CBC    Component Value Date/Time   WBC 7.3 11/21/2018 1221   RBC 5.44 11/21/2018 1221   HGB 16.6 11/21/2018 1221   HCT 47.0 11/21/2018 1221   PLT 247 11/21/2018 1221   MCV 86 11/21/2018 1221   MCH 30.5 11/21/2018 1221   MCHC 35.3 11/21/2018 1221   RDW 12.9 11/21/2018 1221   LYMPHSABS 2.4 11/21/2018 1221   EOSABS 0.1 11/21/2018 1221   BASOSABS 0.1 11/21/2018 1221    CMP     Component Value Date/Time   NA 136 11/27/2018 1140   K 4.1 11/27/2018 1140   CL 98 11/27/2018 1140   CO2 22 11/27/2018 1140   GLUCOSE 277 (H) 11/27/2018 1140   BUN 12 11/27/2018 1140   CREATININE 0.58 (L) 11/27/2018 1140   CALCIUM 9.6 11/27/2018 1140    PROT 7.0 11/27/2018 1140   ALBUMIN 4.5 11/27/2018 1140   AST 47 (H) 11/27/2018 1140   ALT 142 (H) 11/27/2018 1140   ALKPHOS 183 (H) 11/27/2018 1140   BILITOT 0.8 11/27/2018 1140   GFRNONAA 140 11/27/2018 1140   GFRAA 162 11/27/2018 1140   EXAM: ULTRASOUND ABDOMEN LIMITED RIGHT UPPER QUADRANT  COMPARISON:  None.  FINDINGS: Gallbladder:  No gallstones or wall thickening visualized. No sonographic Murphy sign noted by sonographer.  Common bile duct:  Diameter: Normal caliber, 3 mm  Liver:  Increased echotexture compatible with fatty infiltration. No focal abnormality or biliary ductal dilatation. Portal vein is patent on color Doppler imaging with normal direction of blood flow towards the liver.  IMPRESSION: Mild fatty infiltration of the liver.  No acute findings.   Electronically Signed   By: Rolm Baptise M.D.   On: 11/30/2018 15:18  Assessment: 1.  Elevated LFTs: For the past 3 weeks, ever since patient has been started on diabetic medication; question relation to medication versus fatty liver versus other 2.  Fatty liver: Seen on imaging above  Plan: 1.  Discussed with patient that we will run further liver serologies to rule out causes such as autoimmune liver disease.  Ordered total IgA, ttG IGA, ceruloplasmin, alpha-1 antitrypsin, AMA, ASMA, ANA today and iron studies. 2.  Discussed with patient that the only recommendations for fatty liver are slow and steady weight loss.  Likely control of his diabetes will help with this to. 3.  Patient to follow in clinic with me in 4 weeks or sooner as directed by labs above. Assigned to Dr. Carlean Purl today.  Ellouise Newer, PA-C [facility] 12/04/2018, 9:51 AM  Cc: Henson, Vickie L, NP-C

## 2018-12-06 LAB — MITOCHONDRIAL ANTIBODIES: Mitochondrial M2 Ab, IgG: <=20 U

## 2018-12-06 LAB — TISSUE TRANSGLUTAMINASE, IGA: (TTG) AB, IGA: 1 U/mL

## 2018-12-06 LAB — ALPHA-1-ANTITRYPSIN: A-1 Antitrypsin, Ser: 118 mg/dL (ref 83–199)

## 2018-12-06 LAB — ANA: Anti Nuclear Antibody(ANA): NEGATIVE

## 2018-12-06 LAB — ANTI-SMOOTH MUSCLE ANTIBODY, IGG: Actin (Smooth Muscle) Antibody (IGG): <20 U (ref <20)

## 2018-12-06 LAB — CERULOPLASMIN: Ceruloplasmin: 33 mg/dL (ref 18–36)

## 2018-12-07 ENCOUNTER — Telehealth: Payer: Self-pay | Admitting: Internal Medicine

## 2018-12-07 NOTE — Progress Notes (Signed)
Evidence available suggests he has fatty liver disease. I guess NAFLD as he is not drinking. He should avoid alcohol also.  I do not think a liver biopsy will change management and PLTs NL, INR NL so liver working ok.  He is getting Tx for his DM which is important Ok to use a statin if necessary  Would recheck LFT's through PCP at least in 2 months and we can see him back as needed through PCP and am happy to answer ? About the LFT's as they come in.  I have cced his PCP

## 2018-12-07 NOTE — Telephone Encounter (Signed)
Pt called and wanted to let us know his insurance is not in network with diabetes nutritionist. He gave a list of dietitian but one place that I found is a diabetic pediatric place which he can't go, the other dietitian are not specialized in diabetes. I called pt to advise him we would cancel the refer, since noone is in the network

## 2018-12-11 ENCOUNTER — Encounter: Payer: Self-pay | Admitting: Family Medicine

## 2018-12-11 ENCOUNTER — Ambulatory Visit (INDEPENDENT_AMBULATORY_CARE_PROVIDER_SITE_OTHER): Payer: Managed Care, Other (non HMO) | Admitting: Family Medicine

## 2018-12-11 VITALS — BP 120/72 | HR 77 | Wt 181.2 lb

## 2018-12-11 DIAGNOSIS — E119 Type 2 diabetes mellitus without complications: Secondary | ICD-10-CM | POA: Diagnosis not present

## 2018-12-11 DIAGNOSIS — E785 Hyperlipidemia, unspecified: Secondary | ICD-10-CM | POA: Diagnosis not present

## 2018-12-11 DIAGNOSIS — E1169 Type 2 diabetes mellitus with other specified complication: Secondary | ICD-10-CM

## 2018-12-11 LAB — GLUCOSE, POCT (MANUAL RESULT ENTRY): POC Glucose: 136 mg/dl — AB (ref 70–99)

## 2018-12-11 NOTE — Progress Notes (Signed)
   Subjective:    Patient ID: Sean Gonzalez, male    DOB: Aug 19, 1991, 28 y.o.   MRN: [id number]  HPI Chief Complaint  Patient presents with  . follow-up    2 week follow-up. lowest was 155.    He is here to follow up on new diabetes. Hgb A1c 14% on 11/21/2018  Reports good daily compliance with medications. Metformin 500 mg twice daily  Farxiga in the morning.  Toujeo 14 units   Denies any low blood sugars.  He has not seen any blood sugars in the 300s  FBS 136- 155 and 250 in the evening. 136 today.   Denies any new concerns or complaints today.  Denies polyuria polydipsia.  Denies fever, chills, dizziness, chest pain, palpitations, shortness of breath, abdominal pain, nausea, vomiting, diarrhea.  He is seeing GI for elevated liver function tests.  He has not yet on a statin or ACE inhibitor.   Review of Systems Pertinent positives and negatives in the history of present illness.     Objective:   Physical Exam BP 120/72   Pulse 77   Wt 181 lb 3.2 oz (82.2 kg)   BMI 28.38 kg/m   Alert and oriented and in no acute distress.  Not otherwise examined.      Assessment & Plan:  Diabetes mellitus, new onset (HCC) - Plan: Glucose (CBG)  Hyperlipidemia associated with type 2 diabetes mellitus (HCC)  PCOT fasting finger stick glucose 136 He appears to have excepted his new diagnosis of diabetes and is doing well on medications.  Blood sugars are improving significantly and closer to goal range.  He has made dietary adjustments.  States he was unable to see the nutritionist that I referred him to due to financial restraints. He will continue on metformin twice daily, [place] once daily and Toujeo.  He will begin reducing his dose of Toujeo by 2 units every couple of days as long as his blood sugars are continuing to be closer to goal range.  Our goal will be to get him off of insulin as soon as possible.  We may need to add a GLP-1 in the near future. He is following  up with GI due to elevated liver function testing.  We will hold off on starting him on a statin for now until cleared by GI. He will follow-up in 4 weeks and we will recheck a hemoglobin A1c at that time. Consider starting on statin and ACE inhibitor at that visit.

## 2018-12-11 NOTE — Patient Instructions (Addendum)
Your blood sugar today is 136 and this is closer to goal range.  You may reduce the Toujeo to 12 units daily for the next couple of days and if your blood sugars are in goal range (Fasting blood sugar goal range 80-1 30; 2-hour postmeal goal range 130-160)  then you may continue reducing the units by 2 every couple of days keeping your blood sugars in goal range.   Continue on the twice daily metformin and once daily [place].   Call and schedule your follow-up appointment with GI as recommended.  I will see you back in 4 weeks or sooner if needed.   Carbohydrate Counting for Diabetes Mellitus, Adult  Carbohydrate counting is a method of keeping track of how many carbohydrates you eat. Eating carbohydrates naturally increases the amount of sugar (glucose) in the blood. Counting how many carbohydrates you eat helps keep your blood glucose within normal limits, which helps you manage your diabetes (diabetes mellitus). It is important to know how many carbohydrates you can safely have in each meal. This is different for every person. A diet and nutrition specialist (registered dietitian) can help you make a meal plan and calculate how many carbohydrates you should have at each meal and snack. Carbohydrates are found in the following foods:  Grains, such as breads and cereals.  Dried beans and soy products.  Starchy vegetables, such as potatoes, peas, and corn.  Fruit and fruit juices.  Milk and yogurt.  Sweets and snack foods, such as cake, cookies, candy, chips, and soft drinks. How do I count carbohydrates? There are two ways to count carbohydrates in food. You can use either of the methods or a combination of both. Reading "Nutrition Facts" on packaged food The "Nutrition Facts" list is included on the labels of almost all packaged foods and beverages in the [place] It includes:  The serving size.  Information about nutrients in each serving, including the grams (g) of carbohydrate  per serving. To use the "Nutrition Facts":  Decide how many servings you will have.  Multiply the number of servings by the number of carbohydrates per serving.  The resulting number is the total amount of carbohydrates that you will be having. Learning standard serving sizes of other foods When you eat carbohydrate foods that are not packaged or do not include "Nutrition Facts" on the label, you need to measure the servings in order to count the amount of carbohydrates:  Measure the foods that you will eat with a food scale or measuring cup, if needed.  Decide how many standard-size servings you will eat.  Multiply the number of servings by 15. Most carbohydrate-rich foods have about 15 g of carbohydrates per serving. ? For example, if you eat 8 oz (170 g) of strawberries, you will have eaten 2 servings and 30 g of carbohydrates (2 servings x 15 g = 30 g).  For foods that have more than one food mixed, such as soups and casseroles, you must count the carbohydrates in each food that is included. The following list contains standard serving sizes of common carbohydrate-rich foods. Each of these servings has about 15 g of carbohydrates:   hamburger bun or  English muffin.   oz (15 mL) syrup.   oz (14 g) jelly.  1 slice of bread.  1 six-inch tortilla.  3 oz (85 g) cooked rice or pasta.  4 oz (113 g) cooked dried beans.  4 oz (113 g) starchy vegetable, such as peas, corn, or potatoes.  4 oz (113 g) hot cereal.  4 oz (113 g) mashed potatoes or  of a large baked potato.  4 oz (113 g) canned or frozen fruit.  4 oz (120 mL) fruit juice.  4-6 crackers.  6 chicken nuggets.  6 oz (170 g) unsweetened dry cereal.  6 oz (170 g) plain fat-free yogurt or yogurt sweetened with artificial sweeteners.  8 oz (240 mL) milk.  8 oz (170 g) fresh fruit or one small piece of fruit.  24 oz (680 g) popped popcorn. Example of carbohydrate counting Sample meal  3 oz (85 g)  chicken breast.  6 oz (170 g) brown rice.  4 oz (113 g) corn.  8 oz (240 mL) milk.  8 oz (170 g) strawberries with sugar-free whipped topping. Carbohydrate calculation 1. Identify the foods that contain carbohydrates: ? Rice. ? Corn. ? Milk. ? Strawberries. 2. Calculate how many servings you have of each food: ? 2 servings rice. ? 1 serving corn. ? 1 serving milk. ? 1 serving strawberries. 3. Multiply each number of servings by 15 g: ? 2 servings rice x 15 g = 30 g. ? 1 serving corn x 15 g = 15 g. ? 1 serving milk x 15 g = 15 g. ? 1 serving strawberries x 15 g = 15 g. 4. Add together all of the amounts to find the total grams of carbohydrates eaten: ? 30 g + 15 g + 15 g + 15 g = 75 g of carbohydrates total. Summary  Carbohydrate counting is a method of keeping track of how many carbohydrates you eat.  Eating carbohydrates naturally increases the amount of sugar (glucose) in the blood.  Counting how many carbohydrates you eat helps keep your blood glucose within normal limits, which helps you manage your diabetes.  A diet and nutrition specialist (registered dietitian) can help you make a meal plan and calculate how many carbohydrates you should have at each meal and snack. This information is not intended to replace advice given to you by your health care provider. Make sure you discuss any questions you have with your health care provider. Document Released: 10/18/2005 Document Revised: 04/27/2017 Document Reviewed: 03/31/2016 Elsevier Interactive Patient Education  2019 [employer]

## 2018-12-18 ENCOUNTER — Ambulatory Visit: Payer: Self-pay | Admitting: Dietician

## 2018-12-29 ENCOUNTER — Encounter: Payer: Self-pay | Admitting: Physician Assistant

## 2018-12-29 ENCOUNTER — Ambulatory Visit (INDEPENDENT_AMBULATORY_CARE_PROVIDER_SITE_OTHER): Payer: BLUE CROSS/BLUE SHIELD | Admitting: Physician Assistant

## 2018-12-29 VITALS — BP 118/82 | HR 92 | Ht 67.0 in | Wt 179.0 lb

## 2018-12-29 DIAGNOSIS — R945 Abnormal results of liver function studies: Secondary | ICD-10-CM | POA: Diagnosis not present

## 2018-12-29 DIAGNOSIS — R7989 Other specified abnormal findings of blood chemistry: Secondary | ICD-10-CM

## 2018-12-29 NOTE — Patient Instructions (Signed)
If you are age 28 or older, your body mass index should be between 23-30. Your Body mass index is 28.04 kg/m. If this is out of the aforementioned range listed, please consider follow up with your Primary Care Provider.  If you are age 27 or younger, your body mass index should be between 19-25. Your Body mass index is 28.04 kg/m. If this is out of the aformentioned range listed, please consider follow up with your Primary Care Provider.   Follow up as needed.  Thank you for choosing me and [facility].    Hyacinth Meeker, PA-C

## 2018-12-29 NOTE — Progress Notes (Signed)
Chief Complaint: Follow-up elevated LFTs  HPI:    [name] is a 28 year old male with a past medical history as listed below, assigned to Dr. Carlean Purl at last visit, who returns to clinic today for follow-up of his elevated LFTs.    11/27/2018 CMP with an alk phos of 183 (202 on 11/13/2018), AST 47 (42 on 11/13/2018) and ALT 142 (135 on 11/13/2018).  Prior to this 3 weeks ago T bili elevated at 1.6, alk phos 187, AST 70, ALT 179.   11/21/2018 hep B acute panel today, PT/INR normal.    11/30/2018 right upper quadrant ultrasound with mild fatty infiltration of the liver no other acute findings.    12/04/2018 office visit with me to discuss elevated LFTs.  Full liver work-up ordered including total IgA, TTG IgA, ceruloplasmin, alpha-1 antitrypsin, AMA, ASMA, ANA and iron studies.  All testing returned negative/normal.  It was thought likely his elevated LFTs are from fatty liver.  Thought to likely improve with diabetic control.    Today, the patient has clinic accompanied by his friend and tells me that he is feeling well.  He has had no symptoms since being seen last.  We discussed alcohol intake but the patient tells me he may have 1 alcoholic drink a year if that.    Denies fever, chills, weight loss, anorexia, nausea, vomiting, heartburn, reflux, abdominal pain, change in bowel habits or symptoms that awaken him from sleep.  Past Medical History:  Diagnosis Date  . Diabetes mellitus, new onset ([place]) 11/27/2018  . Elevated liver function tests 11/27/2018    Past Surgical History:  Procedure Laterality Date  . NO PAST SURGERIES      Current Outpatient Medications  Medication Sig Dispense Refill  . dapagliflozin propanediol (FARXIGA) 5 MG TABS tablet Take 5 mg by mouth daily. 30 tablet 2  . glucose blood (ONETOUCH VERIO) test strip Test twice a day. Pt uses onetouch verio flex meter 100 each 2  . Insulin Glargine, 1 Unit Dial, (TOUJEO SOLOSTAR) 300 UNIT/ML SOPN Inject 14 Units into the skin  daily. 5 pen 0  . metFORMIN (GLUCOPHAGE) 500 MG tablet Take 1 tablet (500 mg total) by mouth 2 (two) times daily with a meal. 60 tablet 2  . ONETOUCH DELICA LANCETS 76E MISC Test twice a day. Pt uses onetouch verio flex meter 100 each 1   No current facility-administered medications for this visit.     Allergies as of 12/29/2018  . (No Known Allergies)    Family History  Problem Relation Age of Onset  . Diabetes Mother   . Stomach cancer Neg Hx   . Colon cancer Neg Hx   . Pancreatic cancer Neg Hx     Social History   Socioeconomic History  . Marital status: Single    Spouse name: Not on file  . Number of children: 0  . Years of education: Not on file  . Highest education level: Not on file  Occupational History  . Occupation: Therapist, art  Social Needs  . Financial resource strain: Not on file  . Food insecurity:    Worry: Not on file    Inability: Not on file  . Transportation needs:    Medical: Not on file    Non-medical: Not on file  Tobacco Use  . Smoking status: Never Smoker  . Smokeless tobacco: Never Used  Substance and Sexual Activity  . Alcohol use: Not Currently    Frequency: Never  . Drug use: Never  .  Sexual activity: Yes    Partners: Female  Lifestyle  . Physical activity:    Days per week: Not on file    Minutes per session: Not on file  . Stress: Not on file  Relationships  . Social connections:    Talks on phone: Not on file    Gets together: Not on file    Attends religious service: Not on file    Active member of club or organization: Not on file    Attends meetings of clubs or organizations: Not on file    Relationship status: Not on file  . Intimate partner violence:    Fear of current or ex partner: Not on file    Emotionally abused: Not on file    Physically abused: Not on file    Forced sexual activity: Not on file  Other Topics Concern  . Not on file  Social History Narrative  . Not on file    Review of Systems:      Constitutional: No weight loss, fever or chills Cardiovascular: No chest pain  Respiratory: No SOB  Gastrointestinal: See HPI and otherwise negative   Physical Exam:  Vital signs: BP 118/82   Pulse 92   Ht '5\' 7"'$  (1.702 m)   Wt 179 lb (81.2 kg)   BMI 28.04 kg/m   Constitutional:   Pleasant male appears to be in NAD, Well developed, Well nourished, alert and cooperative Respiratory: Respirations even and unlabored. Lungs clear to auscultation bilaterally.   No wheezes, crackles, or rhonchi.  Cardiovascular: Normal S1, S2. No MRG. Regular rate and rhythm. No peripheral edema, cyanosis or pallor.  Gastrointestinal:  Soft, nondistended, nontender. No rebound or guarding. Normal bowel sounds. No appreciable masses or hepatomegaly. Psychiatric: Demonstrates good judgement and reason without abnormal affect or behaviors.  See Hpi for recent labs.  Assessment: 1.  Elevated LFTs: Full work-up was normal/negative other than a right upper quadrant ultrasound showing fatty liver which is likely the cause  Plan: 1.  Recommend patient have recheck of LFTs in 2 months by his PCP.  They should continue to follow them from there unless there is concern regarding great increase or other. 2.  Patient to follow in clinic with me or Dr. Carlean Purl as needed in the future.  Ellouise Newer, PA-C [facility] 12/29/2018, 11:20 AM  Cc: Henson, Vickie L, NP-C

## 2019-01-07 NOTE — Progress Notes (Signed)
   Subjective:    Patient ID: Sean Gonzalez, male    DOB: 08-Nov-1990, 28 y.o.   MRN: [id number]  HPI Chief Complaint  Patient presents with  . med check    med check and DM. BS ranging 150. trying to check everyday. this morning BS was 183   He is here to follow up on new onset diabetes.  Hemoglobin A1c on 11/21/2018 was 14%. We started him on metformin and Toujeo.  1 week later we started him on Farxiga. He admits to missing doses of the metformin, mainly the evening dose.  FBS ranges in the 120s to the highest today was 183 per patient. States he did not take his Metformin last night and diet has been poor.    States he has been cutting back the Toujeo dose.  States his goal is to eventually get off of the insulin.  Currently he is at 8 units.  States he has been eating "what ever I want".  No exercise.  He saw GI for elevated liver function tests.  He was advised to stop alcohol.  His girlfriend today states that he has an upcoming vacation and will be drinking alcohol.  Denies fever, chills, dizziness, chest pain, shortness of breath, abdominal pain, nausea, vomiting, diarrhea, urinary symptoms.   Review of Systems Pertinent positives and negatives in the history of present illness.     Objective:   Physical Exam BP 120/70   Pulse 98   Wt 177 lb (80.3 kg)   BMI 27.72 kg/m   Alert and oriented and in no acute distress.  Not otherwise examined.      Assessment & Plan:  Diabetes mellitus, new onset (HCC) - Plan: Insulin Glargine, 1 Unit Dial, (TOUJEO SOLOSTAR) 300 UNIT/ML SOPN  Elevated liver function tests  Hyperlipidemia associated with type 2 diabetes mellitus (HCC)  Discussed missing doses of his metformin while cutting back on his basal insulin along with his unhealthy diet is leading to uncontrolled fasting blood sugars.  He did not bring in his readings today. Encouraged him to be more compliant with medications.  Recommend he go back to 10 units of  Toujeo daily.  Continue on twice daily 500 mg metformin.  Continue on twice daily Farxiga. He will need repeat liver function tests in late April per GI. He is not currently on a statin and wishes to hold off on this.  We will repeat fasting lipids at his appointment in late April.

## 2019-01-08 ENCOUNTER — Encounter: Payer: Self-pay | Admitting: Family Medicine

## 2019-01-08 ENCOUNTER — Ambulatory Visit (INDEPENDENT_AMBULATORY_CARE_PROVIDER_SITE_OTHER): Payer: Managed Care, Other (non HMO) | Admitting: Family Medicine

## 2019-01-08 VITALS — BP 120/70 | HR 98 | Wt 177.0 lb

## 2019-01-08 DIAGNOSIS — E1169 Type 2 diabetes mellitus with other specified complication: Secondary | ICD-10-CM | POA: Diagnosis not present

## 2019-01-08 DIAGNOSIS — R945 Abnormal results of liver function studies: Secondary | ICD-10-CM | POA: Diagnosis not present

## 2019-01-08 DIAGNOSIS — E785 Hyperlipidemia, unspecified: Secondary | ICD-10-CM

## 2019-01-08 DIAGNOSIS — R7989 Other specified abnormal findings of blood chemistry: Secondary | ICD-10-CM

## 2019-01-08 DIAGNOSIS — E119 Type 2 diabetes mellitus without complications: Secondary | ICD-10-CM

## 2019-01-08 MED ORDER — INSULIN GLARGINE (1 UNIT DIAL) 300 UNIT/ML ~~LOC~~ SOPN: 10.0000 [IU] | PEN_INJECTOR | Freq: Every day | SUBCUTANEOUS | 1 refills | Status: DC

## 2019-01-08 NOTE — Patient Instructions (Addendum)
Keep an eye on your blood sugars and if your fasting blood sugar is higher than 130 and do not decrease your Toujeo dose.  I recommend that you go back to 10 units daily for now.  Take your medications daily as prescribed and limit your carbohydrates and sweets.  Return the last week of April as discussed for a diabetes check and also a recheck of your liver enzymes. Come in fasting to this appointment so we can check your cholesterol

## 2019-02-26 ENCOUNTER — Ambulatory Visit: Payer: Managed Care, Other (non HMO) | Admitting: Family Medicine

## 2019-03-09 ENCOUNTER — Ambulatory Visit: Payer: Managed Care, Other (non HMO) | Admitting: Family Medicine

## 2019-03-09 ENCOUNTER — Other Ambulatory Visit: Payer: Self-pay | Admitting: Family Medicine

## 2019-03-09 DIAGNOSIS — E119 Type 2 diabetes mellitus without complications: Secondary | ICD-10-CM

## 2019-03-16 ENCOUNTER — Ambulatory Visit: Payer: BLUE CROSS/BLUE SHIELD | Admitting: Family Medicine

## 2019-03-19 ENCOUNTER — Encounter: Payer: Self-pay | Admitting: Family Medicine

## 2019-03-19 ENCOUNTER — Ambulatory Visit: Payer: BLUE CROSS/BLUE SHIELD | Admitting: Family Medicine

## 2019-03-19 ENCOUNTER — Other Ambulatory Visit: Payer: Self-pay

## 2019-03-19 VITALS — BP 120/80 | HR 93 | Temp 97.8°F | Wt 176.0 lb

## 2019-03-19 DIAGNOSIS — K760 Fatty (change of) liver, not elsewhere classified: Secondary | ICD-10-CM | POA: Diagnosis not present

## 2019-03-19 DIAGNOSIS — R945 Abnormal results of liver function studies: Secondary | ICD-10-CM

## 2019-03-19 DIAGNOSIS — R822 Biliuria: Secondary | ICD-10-CM | POA: Diagnosis not present

## 2019-03-19 DIAGNOSIS — E1169 Type 2 diabetes mellitus with other specified complication: Secondary | ICD-10-CM

## 2019-03-19 DIAGNOSIS — E785 Hyperlipidemia, unspecified: Secondary | ICD-10-CM | POA: Diagnosis not present

## 2019-03-19 DIAGNOSIS — R7989 Other specified abnormal findings of blood chemistry: Secondary | ICD-10-CM

## 2019-03-19 DIAGNOSIS — E119 Type 2 diabetes mellitus without complications: Secondary | ICD-10-CM | POA: Diagnosis not present

## 2019-03-19 HISTORY — DX: Fatty (change of) liver, not elsewhere classified: K76.0

## 2019-03-19 LAB — POCT URINALYSIS DIP (PROADVANTAGE DEVICE)
Bilirubin, UA: NEGATIVE
Blood, UA: NEGATIVE
Glucose, UA: >=1000 mg/dL — AB
Ketones, POC UA: NEGATIVE mg/dL
Leukocytes, UA: NEGATIVE
Nitrite, UA: NEGATIVE
Protein Ur, POC: NEGATIVE mg/dL
Specific Gravity, Urine: 1.015
Urobilinogen, Ur: NEGATIVE
pH, UA: 6 (ref 5.0–8.0)

## 2019-03-19 LAB — POCT GLYCOSYLATED HEMOGLOBIN (HGB A1C): Hemoglobin A1C: 8.5 % — AB (ref 4.0–5.6)

## 2019-03-19 MED ORDER — INSULIN GLARGINE (1 UNIT DIAL) 300 UNIT/ML ~~LOC~~ SOPN: 12.0000 [IU] | PEN_INJECTOR | Freq: Every day | SUBCUTANEOUS | 1 refills | Status: DC

## 2019-03-19 MED ORDER — METFORMIN HCL 1000 MG PO TABS: 1000.0000 mg | ORAL_TABLET | Freq: Two times a day (BID) | ORAL | 0 refills | Status: DC

## 2019-03-19 NOTE — Progress Notes (Signed)
Subjective:    Patient ID: Sean Gonzalez, male    DOB: [birth year], 28 y.o.   MRN: [id number]  Sean Gonzalez is a 28 y.o. male who presents for follow-up of Type 2 diabetes mellitus and HL.   He has been worked up at GI for elevated LFTs and fatty liver. Needs liver function rechecked. denies alcohol and NSAID use.   Patient is checking home blood sugars.   Home blood sugar records: BGs range between 130 and 145 How often is blood sugars being checked: testing up to twice a day Current symptoms include: none. Patient denies increased appetite, nausea, visual disturbances, vomiting and weight loss.  Patient is checking their feet daily. Any Foot concerns (callous, ulcer, wound, thickened nails, toenail fungus, skin fungus, hammer toe): none Last dilated eye exam: December 2019 but not diabetic  Current treatments: doing well dm med. Metformin 500mg  bid, Farxiga 5 mg and Toujeo 10 units daily Medication compliance: good  Current diet: in general, a "healthy" diet   Current exercise: walking daily  Known diabetic complications: none  The following portions of the patient's history were reviewed and updated as appropriate: allergies, current medications, past medical history, past social history and problem list.  ROS as in subjective above.     Objective:    Physical Exam Alert and in no distress otherwise not examined.  Blood pressure 120/80, pulse 93, temperature 97.8 F (36.6 C), temperature source Oral, weight 176 lb (79.8 kg).  Lab Review Diabetic Labs Latest Ref Rng & Units 03/19/2019 11/27/2018 11/21/2018 11/13/2018  HbA1c 4.0 - 5.6 % 8.5(A) - 14.0(A) -  Chol 100 - 199 mg/dL - - - 657(Q)  HDL >46 mg/dL - - - 58  Calc LDL 0 - 99 mg/dL - - - 962(X)  Triglycerides 0 - 149 mg/dL - - - 528(U)  Creatinine 0.76 - 1.27 mg/dL - 1.32(G) 4.01(U) 2.72(Z)   BP/Weight 03/19/2019 01/08/2019 12/29/2018 12/11/2018 12/04/2018  Systolic BP 120 120 118 120 130  Diastolic BP 80 70  82 72 78  Wt. (Lbs) 176 177 179 181.2 177.38  BMI 27.57 27.72 28.04 28.38 27.78   No flowsheet data found.  Sean Gonzalez  reports that he has never smoked. He has never used smokeless tobacco. He reports previous alcohol use. He reports that he does not use drugs.     Assessment & Plan:    Diabetes mellitus, new onset (HCC) - Plan: CBC with Differential/Platelet, Comprehensive metabolic panel, POCT glycosylated hemoglobin (Hb A1C), Microalbumin / creatinine urine ratio, Insulin Glargine, 1 Unit Dial, (TOUJEO SOLOSTAR) 300 UNIT/ML SOPN  Hyperlipidemia associated with type 2 diabetes mellitus (HCC) - Plan: Lipid panel  Elevated liver function tests - Plan: Comprehensive metabolic panel  Hepatic steatosis  Bilirubin in urine - Plan: POCT Urinalysis DIP (Proadvantage Device)  1. Rx changes: increase Metformin to 1,000 mg bid, increase Toujeo to 12 units daily and continue Farxiga 5 mg.   2. He is not on a statin but we have discussed this on several occasions. He has been hesitant to start. Recheck lipids today and prescribe statin if he is willing.  3. Urinalysis dipstick: negative except for expected glucosuria due to [place]  4. Elevated LFTs and fatty liver- recheck liver function  5. Education: Reviewed 'ABCs' of diabetes management (respective goals in parentheses):  A1C (<7), blood pressure (<130/80), and cholesterol (LDL <100). 6. Compliance at present is estimated to be fair. Efforts to improve compliance (if necessary) will be directed at dietary modifications: eat  a low carbohydrate diet and limit sweets, increased exercise and regular blood sugar monitoring: vary times of the day he checks his blood sugars including fasting, 2 hours after a meal and bring in the readings at his follow up. 7. Recommend that we refer him to endocrinology for better control of diabetes and he refuses.  8. Follow up: 3 months

## 2019-03-19 NOTE — Patient Instructions (Addendum)
Your hemoglobin A1c is 8.5% today.  This has improved significantly but still not to goal.  I am increasing your metformin to 1000 mg twice daily with meals.  I also recommend that you increase your Toujeo from 10 units to 12 units daily.  Check your blood sugars at different times of the day.  Some days fasting, some days 2 hours after breakfast or lunch or supper. Let us look and see when your blood sugars are elevated.  Goal fasting blood sugar is 80-120. 2 hours after a meal the goal blood sugar is 130-160.  Continue to eat a low carb diet and limit sugary foods and drinks.  Try to get at least 150 minutes of physical activity per week.  Brisk walking is a great activity.  If you change your mind, and would like to be referred to an endocrinologist, please let me know.  I am rechecking her cholesterol today.  We will most likely need to start you on a medication for your cholesterol called a statin.  I will see you back in 3 months.

## 2019-03-20 LAB — CBC WITH DIFFERENTIAL/PLATELET
Basophils Absolute: 0.1 10*3/uL (ref 0.0–0.2)
Basos: 1 %
EOS (ABSOLUTE): 0.2 10*3/uL (ref 0.0–0.4)
Eos: 2 %
Hematocrit: 49.9 % (ref 37.5–51.0)
Hemoglobin: 17 g/dL (ref 13.0–17.7)
Immature Grans (Abs): 0 10*3/uL (ref 0.0–0.1)
Immature Granulocytes: 0 %
Lymphocytes Absolute: 2.4 10*3/uL (ref 0.7–3.1)
Lymphs: 30 %
MCH: 29.7 pg (ref 26.6–33.0)
MCHC: 34.1 g/dL (ref 31.5–35.7)
MCV: 87 fL (ref 79–97)
Monocytes Absolute: 0.6 10*3/uL (ref 0.1–0.9)
Monocytes: 7 %
Neutrophils Absolute: 4.8 10*3/uL (ref 1.4–7.0)
Neutrophils: 60 %
Platelets: 244 10*3/uL (ref 150–450)
RBC: 5.73 x10E6/uL (ref 4.14–5.80)
RDW: 13.5 % (ref 11.6–15.4)
WBC: 8.1 10*3/uL (ref 3.4–10.8)

## 2019-03-20 LAB — COMPREHENSIVE METABOLIC PANEL
ALT: 63 IU/L — ABNORMAL HIGH (ref 0–44)
AST: 27 IU/L (ref 0–40)
Albumin/Globulin Ratio: 1.6 (ref 1.2–2.2)
Albumin: 4.6 g/dL (ref 4.1–5.2)
Alkaline Phosphatase: 137 IU/L — ABNORMAL HIGH (ref 39–117)
BUN/Creatinine Ratio: 23 — ABNORMAL HIGH (ref 9–20)
BUN: 15 mg/dL (ref 6–20)
Bilirubin Total: 1.1 mg/dL (ref 0.0–1.2)
CO2: 25 mmol/L (ref 20–29)
Calcium: 9.8 mg/dL (ref 8.7–10.2)
Chloride: 99 mmol/L (ref 96–106)
Creatinine, Ser: 0.65 mg/dL — ABNORMAL LOW (ref 0.76–1.27)
GFR calc Af Amer: 154 mL/min/{1.73_m2} (ref >59)
GFR calc non Af Amer: 133 mL/min/{1.73_m2} (ref >59)
Globulin, Total: 2.8 g/dL (ref 1.5–4.5)
Glucose: 131 mg/dL — ABNORMAL HIGH (ref 65–99)
Potassium: 4.2 mmol/L (ref 3.5–5.2)
Sodium: 141 mmol/L (ref 134–144)
Total Protein: 7.4 g/dL (ref 6.0–8.5)

## 2019-03-20 LAB — MICROALBUMIN / CREATININE URINE RATIO
Creatinine, Urine: 94.2 mg/dL
Microalb/Creat Ratio: 14 mg/g creat (ref 0–29)
Microalbumin, Urine: 13.1 ug/mL

## 2019-03-20 LAB — LIPID PANEL
Chol/HDL Ratio: 3.5 ratio (ref 0.0–5.0)
Cholesterol, Total: 187 mg/dL (ref 100–199)
HDL: 53 mg/dL (ref >39)
LDL Calculated: 106 mg/dL — ABNORMAL HIGH (ref 0–99)
Triglycerides: 141 mg/dL (ref 0–149)
VLDL Cholesterol Cal: 28 mg/dL (ref 5–40)

## 2019-04-14 ENCOUNTER — Other Ambulatory Visit: Payer: Self-pay | Admitting: Family Medicine

## 2019-06-20 ENCOUNTER — Ambulatory Visit: Payer: BLUE CROSS/BLUE SHIELD | Admitting: Family Medicine

## 2019-07-01 NOTE — Progress Notes (Signed)
Subjective:    Patient ID: Sean Gonzalez, male    DOB: 07/14/1991, 28 y.o.   MRN: [id number]  Sean BottsJaime Jimenez Gonzalez is a 28 y.o. male who presents for follow-up of Type 2 diabetes mellitus.  Patient is not checking home blood sugars.   Home blood sugar records: patient does not check sugars How often is blood sugars being checked: none  Current symptoms include: none. Patient denies increased appetite, nausea, visual disturbances, vomiting and weight loss.  Patient is checking their feet daily. Any Foot concerns (callous, ulcer, wound, thickened nails, toenail fungus, skin fungus, hammer toe): none Last dilated eye exam: never  Current treatments: stopped [place] in May on his own. Taking Metformin and Toujeo "when I think I need it". This is on average 1-2 days per week.  Medication compliance: poor  Current diet: in general, a "healthy" diet   Current exercise: running/ jogging and walking Known diabetic complications: none  The following portions of the patient's history were reviewed and updated as appropriate: allergies, current medications, past medical history, past social history and problem list.  ROS as in subjective above.     Objective:    Physical Exam Alert and in no distress.  Cardiac exam shows a regular rhythm without murmurs or gallops. Lungs are clear to auscultation. Extremities without edema. Skin is warm and dry. PERRLA, EOMs intact. CNs intact.    Blood pressure 120/80, pulse 68, temperature (!) 97.1 F (36.2 C), weight 173 lb (78.5 kg).  Lab Review Diabetic Labs Latest Ref Rng & Units 07/02/2019 03/19/2019 11/27/2018 11/21/2018 11/13/2018  HbA1c 4.0 - 5.6 % 14.0(A) 8.5(A) - 14.0(A) -  Chol 100 - 199 mg/dL - 409187 - - 811(B295(H)  HDL >14>39 mg/dL - 53 - - 58  Calc LDL 0 - 99 mg/dL - 782(N106(H) - - 562(Z178(H)  Triglycerides 0 - 149 mg/dL - 308141 - - 657(Q297(H)  Creatinine 0.76 - 1.27 mg/dL - 4.69(G0.65(L) 2.95(M0.58(L) 8.41(L0.65(L) 0.74(L)   BP/Weight 07/02/2019 03/19/2019 01/08/2019  12/29/2018 12/11/2018  Systolic BP 120 120 120 118 120  Diastolic BP 80 80 70 82 72  Wt. (Lbs) 173 176 177 179 181.2  BMI 27.1 27.57 27.72 28.04 28.38   No flowsheet data found.  Sean Gonzalez  reports that he has never smoked. He has never used smokeless tobacco. He reports previous alcohol use. He reports that he does not use drugs.     Assessment & Plan:    Uncontrolled type 2 diabetes mellitus with hyperglycemia (HCC) - Plan: CBC with Differential/Platelet, Comprehensive metabolic panel, TSH, T4, free, T3, Ambulatory referral to Endocrinology  Diabetes mellitus, new onset (HCC) - Plan: HgB A1c, CBC with Differential/Platelet, Comprehensive metabolic panel  Hyperlipidemia associated with type 2 diabetes mellitus (HCC)  Hepatic steatosis  Personal history of noncompliance with medical treatment, presenting hazards to health  1. Rx changes: Hgb A1c >14% once again. poor medication compliance 2. Education: Reviewed 'ABCs' of diabetes management (respective goals in parentheses):  A1C (<7), blood pressure (<130/80), and cholesterol (LDL <100). 3. Compliance at present is estimated to be poor. Efforts to improve compliance (if necessary) will be directed at referral to endocrinologist . 4. Discussed that his poor compliance and uncontrolled diabetes may lead to a poor health outcome and encouraged him once again to take his diagnosis seriously and start taking better care of himself. Discussed risk of heart disease, stroke, blindness, neuropathy and amputation. He verbalized understanding.  5. Referral to endocrinologist.  6. Recommend he get a diabetic eye exam.  7. He initially reported being asymptomatic today but as I was leaving he mentioned having bilateral temporal headaches that resolve with Tylenol. Encouraged him to stay hydrated and follow up if worsening symptoms.  8. Follow up: as needed

## 2019-07-02 ENCOUNTER — Ambulatory Visit (INDEPENDENT_AMBULATORY_CARE_PROVIDER_SITE_OTHER): Payer: BC Managed Care – PPO | Admitting: Family Medicine

## 2019-07-02 ENCOUNTER — Encounter: Payer: Self-pay | Admitting: Family Medicine

## 2019-07-02 ENCOUNTER — Other Ambulatory Visit: Payer: Self-pay

## 2019-07-02 VITALS — BP 120/80 | HR 68 | Temp 97.1°F | Wt 173.0 lb

## 2019-07-02 DIAGNOSIS — E1165 Type 2 diabetes mellitus with hyperglycemia: Secondary | ICD-10-CM | POA: Insufficient documentation

## 2019-07-02 DIAGNOSIS — Z91199 Patient's noncompliance with other medical treatment and regimen due to unspecified reason: Secondary | ICD-10-CM

## 2019-07-02 DIAGNOSIS — Z9119 Patient's noncompliance with other medical treatment and regimen: Secondary | ICD-10-CM

## 2019-07-02 DIAGNOSIS — E785 Hyperlipidemia, unspecified: Secondary | ICD-10-CM

## 2019-07-02 DIAGNOSIS — E119 Type 2 diabetes mellitus without complications: Secondary | ICD-10-CM

## 2019-07-02 DIAGNOSIS — E1169 Type 2 diabetes mellitus with other specified complication: Secondary | ICD-10-CM | POA: Diagnosis not present

## 2019-07-02 DIAGNOSIS — K760 Fatty (change of) liver, not elsewhere classified: Secondary | ICD-10-CM

## 2019-07-02 LAB — POCT GLYCOSYLATED HEMOGLOBIN (HGB A1C): Hemoglobin A1C: 14 % — AB (ref 4.0–5.6)

## 2019-07-02 NOTE — Patient Instructions (Signed)
Your hemoglobin A1c is greater than 14% today.  This is not surprising since you have not been taking your medication. I strongly encourage you to start back on your medications and start checking your blood sugar at home.  As discussed, uncontrolled diabetes can lead to worsening overall health.  I am referring you to an endocrinologist for your diabetes  If you develop any worrisome symptoms while you are waiting to see them, you would need to go to the emergency department     Hyperglycemia Hyperglycemia occurs when the level of sugar (glucose) in the blood is too high. Glucose is a type of sugar that provides the body's main source of energy. Certain hormones (insulin and glucagon) control the level of glucose in the blood. Insulin lowers blood glucose, and glucagon increases blood glucose. Hyperglycemia can result from having too little insulin in the bloodstream, or from the body not responding normally to insulin. Hyperglycemia occurs most often in people who have diabetes (diabetes mellitus), but it can happen in people who do not have diabetes. It can develop quickly, and it can be life-threatening if it causes you to become severely dehydrated (diabetic ketoacidosis or hyperglycemic hyperosmolar state). Severe hyperglycemia is a medical emergency. What are the causes? If you have diabetes, hyperglycemia may be caused by:  Diabetes medicine.  Medicines that increase blood glucose or affect your diabetes control.  Not eating enough, or not eating often enough.  Changes in physical activity level.  Being sick or having an infection. If you have prediabetes or undiagnosed diabetes:  Hyperglycemia may be caused by those conditions. If you do not have diabetes, hyperglycemia may be caused by:  Certain medicines, including steroid medicines, beta-blockers, epinephrine, and thiazide diuretics.  Stress.  Serious illness.  Surgery.  Diseases of the pancreas.  Infection.  What increases the risk? Hyperglycemia is more likely to develop in people who have risk factors for diabetes, such as:  Having a family member with diabetes.  Having a gene for type 1 diabetes that is passed from parent to child (inherited).  Living in an area with cold weather conditions.  Exposure to certain viruses.  Certain conditions in which the body's disease-fighting (immune) system attacks itself (autoimmune disorders).  Being overweight or obese.  Having an inactive (sedentary) lifestyle.  Having been diagnosed with insulin resistance.  Having a history of prediabetes, gestational diabetes, or polycystic ovarian syndrome (PCOS).  Being of American-Indian, African-American, Hispanic/Latino, or Asian/Pacific Islander descent. What are the signs or symptoms? Hyperglycemia may not cause any symptoms. If you do have symptoms, they may include early warning signs, such as:  Increased thirst.  Hunger.  Feeling very tired.  Needing to urinate more often than usual.  Blurry vision. Other symptoms may develop if hyperglycemia gets worse, such as:  Dry mouth.  Loss of appetite.  Fruity-smelling breath.  Weakness.  Unexpected or rapid weight gain or weight loss.  Tingling or numbness in the hands or feet.  Headache.  Skin that does not quickly return to normal after being lightly pinched and released (poor skin turgor).  Abdominal pain.  Cuts or bruises that are slow to heal. How is this diagnosed? Hyperglycemia is diagnosed with a blood test to measure your blood glucose level. This blood test is usually done while you are having symptoms. Your health care provider may also do a physical exam and review your medical history. You may have more tests to determine the cause of your hyperglycemia, such as:  A fasting  blood glucose (FBG) test. You will not be allowed to eat (you will fast) for at least 8 hours before a blood sample is taken.  An A1c  (hemoglobin A1c) blood test. This provides information about blood glucose control over the previous 2-3 months.  An oral glucose tolerance test (OGTT). This measures your blood glucose at two times: ? After fasting. This is your baseline blood glucose level. ? Two hours after drinking a beverage that contains glucose. How is this treated? Treatment depends on the cause of your hyperglycemia. Treatment may include:  Taking medicine to regulate your blood glucose levels. If you take insulin or other diabetes medicines, your medicine or dosage may be adjusted.  Lifestyle changes, such as exercising more, eating healthier foods, or losing weight.  Treating an illness or infection, if this caused your hyperglycemia.  Checking your blood glucose more often.  Stopping or reducing steroid medicines, if these caused your hyperglycemia. If your hyperglycemia becomes severe and it results in hyperglycemic hyperosmolar state, you must be hospitalized and given IV fluids. Follow these instructions at home:  General instructions  Take over-the-counter and prescription medicines only as told by your health care provider.  Do not use any products that contain nicotine or tobacco, such as cigarettes and e-cigarettes. If you need help quitting, ask your health care provider.  Limit alcohol intake to no more than 1 drink per day for nonpregnant women and 2 drinks per day for men. One drink equals 12 oz of beer, 5 oz of wine, or 1 oz of hard liquor.  Learn to manage stress. If you need help with this, ask your health care provider.  Keep all follow-up visits as told by your health care provider. This is important. Eating and drinking   Maintain a healthy weight.  Exercise regularly, as directed by your health care provider.  Stay hydrated, especially when you exercise, get sick, or spend time in hot temperatures.  Eat healthy foods, such as: ? Lean proteins. ? Complex carbohydrates. ? Fresh  fruits and vegetables. ? Low-fat dairy products. ? Healthy fats.  Drink enough fluid to keep your urine clear or pale yellow. If you have diabetes:  Make sure you know the symptoms of hyperglycemia.  Follow your diabetes management plan, as told by your health care provider. Make sure you: ? Take your insulin and medicines as directed. ? Follow your exercise plan. ? Follow your meal plan. Eat on time, and do not skip meals. ? Check your blood glucose as often as directed. Make sure to check your blood glucose before and after exercise. If you exercise longer or in a different way than usual, check your blood glucose more often. ? Follow your sick day plan whenever you cannot eat or drink normally. Make this plan in advance with your health care provider.  Share your diabetes management plan with people in your workplace, school, and household.  Check your urine for ketones when you are ill and as told by your health care provider.  Carry a medical alert card or wear medical alert jewelry. Contact a health care provider if:  Your blood glucose is at or above 240 mg/dL (13.3 mmol/L) for 2 days in a row.  You have problems keeping your blood glucose in your target range.  You have frequent episodes of hyperglycemia. Get help right away if:  You have difficulty breathing.  You have a change in how you think, feel, or act (mental status).  You have nausea or  vomiting that does not go away. These symptoms may represent a serious problem that is an emergency. Do not wait to see if the symptoms will go away. Get medical help right away. Call your local emergency services (911 in the [place] Do not drive yourself to the hospital. Summary  Hyperglycemia occurs when the level of sugar (glucose) in the blood is too high.  Hyperglycemia is diagnosed with a blood test to measure your blood glucose level. This blood test is usually done while you are having symptoms. Your health care provider  may also do a physical exam and review your medical history.  If you have diabetes, follow your diabetes management plan as told by your health care provider.  Contact your health care provider if you have problems keeping your blood glucose in your target range. This information is not intended to replace advice given to you by your health care provider. Make sure you discuss any questions you have with your health care provider. Document Released: 04/13/2001 Document Revised: 07/05/2016 Document Reviewed: 07/05/2016 Elsevier Patient Education  2020 [employer]

## 2019-07-03 LAB — COMPREHENSIVE METABOLIC PANEL
ALT: 166 IU/L — ABNORMAL HIGH (ref 0–44)
AST: 72 IU/L — ABNORMAL HIGH (ref 0–40)
Albumin/Globulin Ratio: 1.5 (ref 1.2–2.2)
Albumin: 4.4 g/dL (ref 4.1–5.2)
Alkaline Phosphatase: 168 IU/L — ABNORMAL HIGH (ref 39–117)
BUN/Creatinine Ratio: 17 (ref 9–20)
BUN: 10 mg/dL (ref 6–20)
Bilirubin Total: 0.8 mg/dL (ref 0.0–1.2)
CO2: 23 mmol/L (ref 20–29)
Calcium: 9 mg/dL (ref 8.7–10.2)
Chloride: 97 mmol/L (ref 96–106)
Creatinine, Ser: 0.58 mg/dL — ABNORMAL LOW (ref 0.76–1.27)
GFR calc Af Amer: 160 mL/min/{1.73_m2} (ref >59)
GFR calc non Af Amer: 139 mL/min/{1.73_m2} (ref >59)
Globulin, Total: 2.9 g/dL (ref 1.5–4.5)
Glucose: 278 mg/dL — ABNORMAL HIGH (ref 65–99)
Potassium: 4.3 mmol/L (ref 3.5–5.2)
Sodium: 138 mmol/L (ref 134–144)
Total Protein: 7.3 g/dL (ref 6.0–8.5)

## 2019-07-03 LAB — CBC WITH DIFFERENTIAL/PLATELET
Basophils Absolute: 0 10*3/uL (ref 0.0–0.2)
Basos: 0 %
EOS (ABSOLUTE): 0 10*3/uL (ref 0.0–0.4)
Eos: 0 %
Hematocrit: 49.5 % (ref 37.5–51.0)
Hemoglobin: 16.9 g/dL (ref 13.0–17.7)
Immature Grans (Abs): 0 10*3/uL (ref 0.0–0.1)
Immature Granulocytes: 0 %
Lymphocytes Absolute: 1.7 10*3/uL (ref 0.7–3.1)
Lymphs: 38 %
MCH: 29.9 pg (ref 26.6–33.0)
MCHC: 34.1 g/dL (ref 31.5–35.7)
MCV: 88 fL (ref 79–97)
Monocytes Absolute: 0.3 10*3/uL (ref 0.1–0.9)
Monocytes: 7 %
Neutrophils Absolute: 2.4 10*3/uL (ref 1.4–7.0)
Neutrophils: 55 %
Platelets: 185 10*3/uL (ref 150–450)
RBC: 5.65 x10E6/uL (ref 4.14–5.80)
RDW: 12.4 % (ref 11.6–15.4)
WBC: 4.5 10*3/uL (ref 3.4–10.8)

## 2019-07-03 LAB — T3: T3, Total: 127 ng/dL (ref 71–180)

## 2019-07-03 LAB — T4, FREE: Free T4: 1.38 ng/dL (ref 0.82–1.77)

## 2019-07-03 LAB — TSH: TSH: 0.776 u[IU]/mL (ref 0.450–4.500)

## 2019-07-04 ENCOUNTER — Encounter: Payer: Self-pay | Admitting: Internal Medicine

## 2019-07-18 DIAGNOSIS — Z20828 Contact with and (suspected) exposure to other viral communicable diseases: Secondary | ICD-10-CM | POA: Diagnosis not present

## 2019-07-19 ENCOUNTER — Other Ambulatory Visit: Payer: Self-pay

## 2019-07-19 DIAGNOSIS — R6889 Other general symptoms and signs: Secondary | ICD-10-CM | POA: Diagnosis not present

## 2019-07-19 DIAGNOSIS — Z20822 Contact with and (suspected) exposure to covid-19: Secondary | ICD-10-CM

## 2019-07-20 LAB — NOVEL CORONAVIRUS, NAA: SARS-CoV-2, NAA: NOT DETECTED

## 2019-07-25 ENCOUNTER — Other Ambulatory Visit: Payer: Self-pay | Admitting: Internal Medicine

## 2019-08-02 ENCOUNTER — Other Ambulatory Visit: Payer: Self-pay

## 2019-08-02 DIAGNOSIS — Z20822 Contact with and (suspected) exposure to covid-19: Secondary | ICD-10-CM

## 2019-08-02 DIAGNOSIS — Z20828 Contact with and (suspected) exposure to other viral communicable diseases: Secondary | ICD-10-CM | POA: Diagnosis not present

## 2019-08-03 LAB — NOVEL CORONAVIRUS, NAA: SARS-CoV-2, NAA: NOT DETECTED

## 2019-10-17 ENCOUNTER — Telehealth: Payer: Self-pay | Admitting: Family Medicine

## 2019-10-17 NOTE — Telephone Encounter (Signed)
Dismissal letter in guarantor snapshot  °

## 2019-11-15 ENCOUNTER — Encounter: Payer: Managed Care, Other (non HMO) | Admitting: Family Medicine

## 2020-01-24 IMAGING — US US ABDOMEN LIMITED
1 series · 14 of 25 positions shown · non-contrast
Comparison: None.

CLINICAL DATA: Elevated LFTs

EXAM:
ULTRASOUND ABDOMEN LIMITED RIGHT UPPER QUADRANT

[Series 1: us abdomen limited · 0.22mm/px · 14 of 35 slices shown]
[im 1/35]
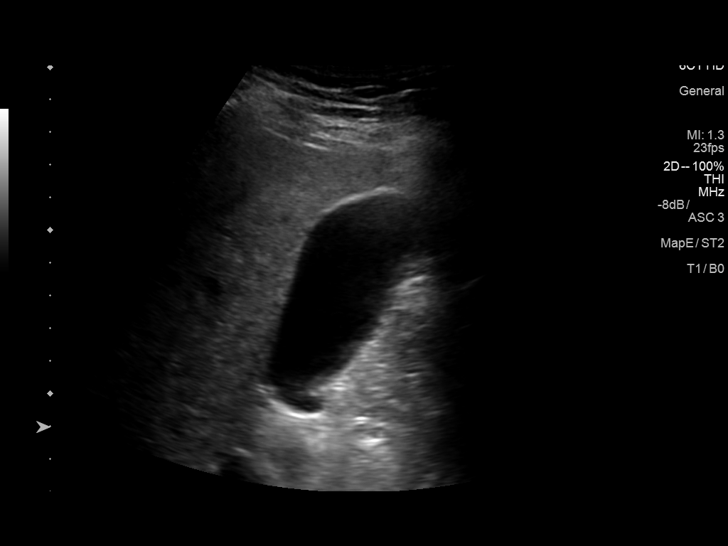
[im 3/35]
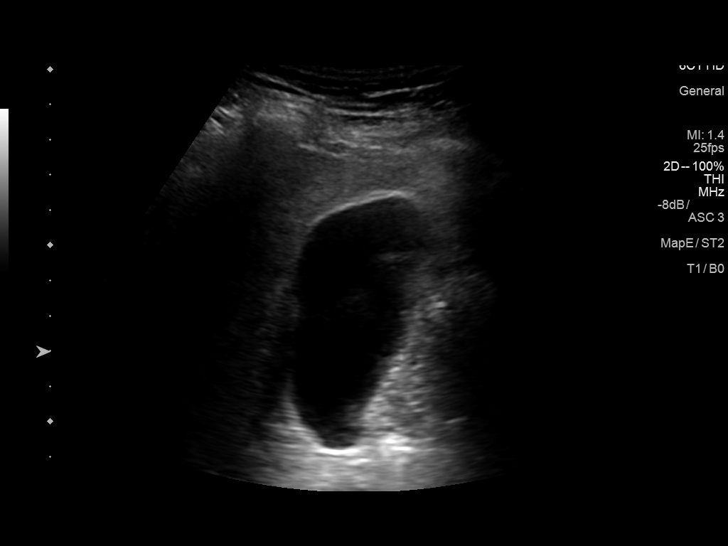
[im 6/35]
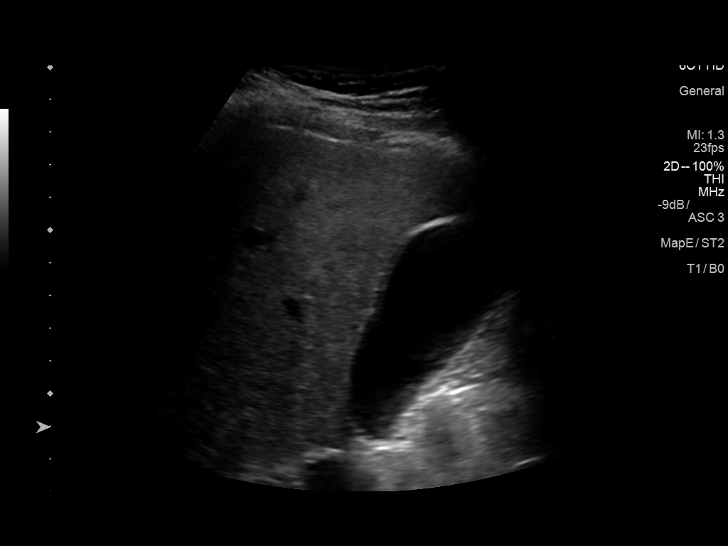
[im 9/35]
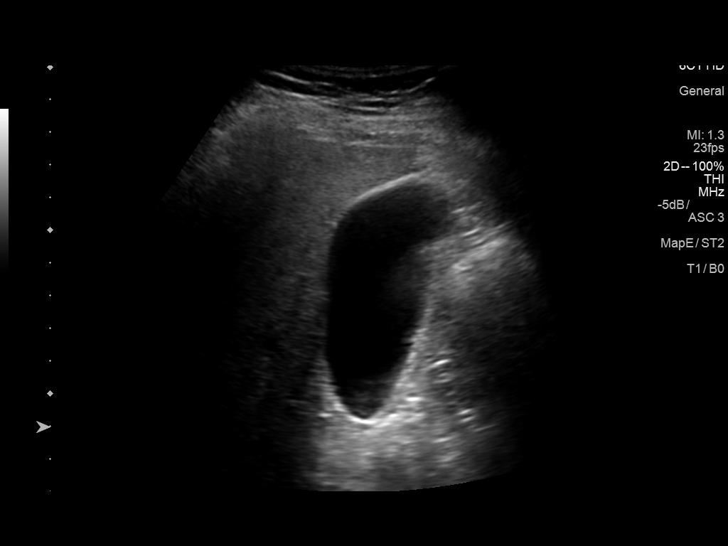
[im 12/35]
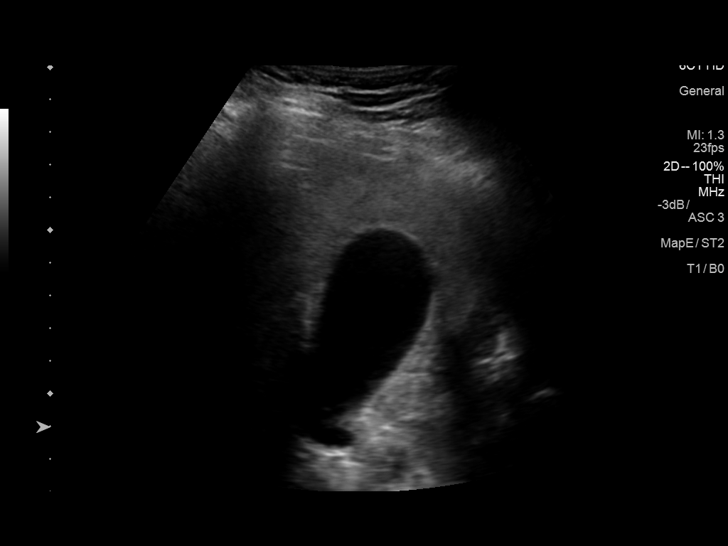
[im 13/35]
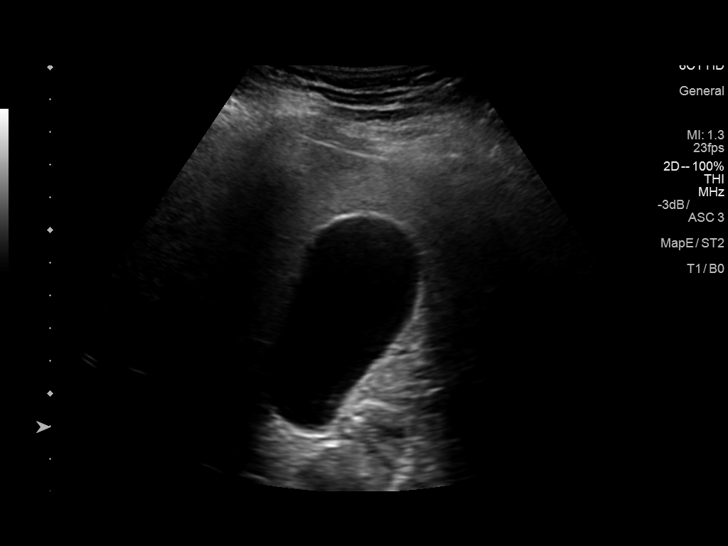
[im 16/35]
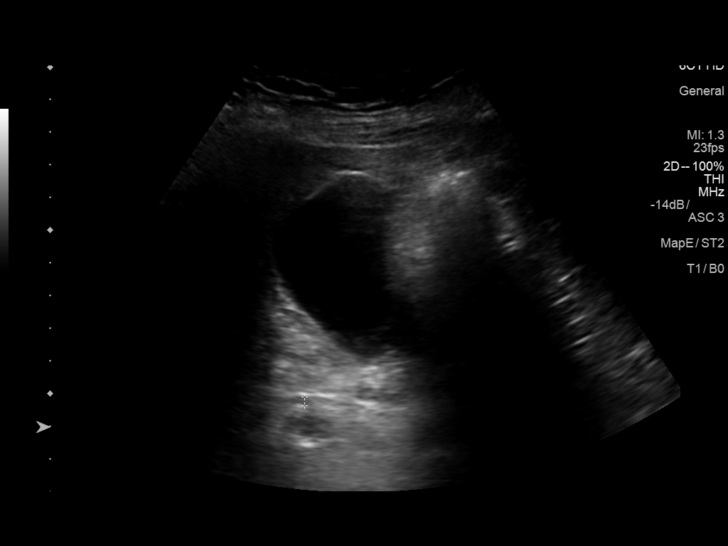
[im 19/35]
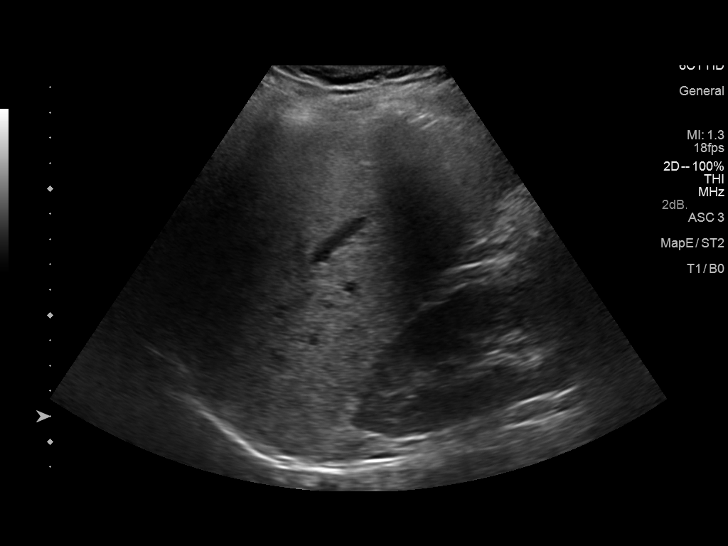
[im 22/35]
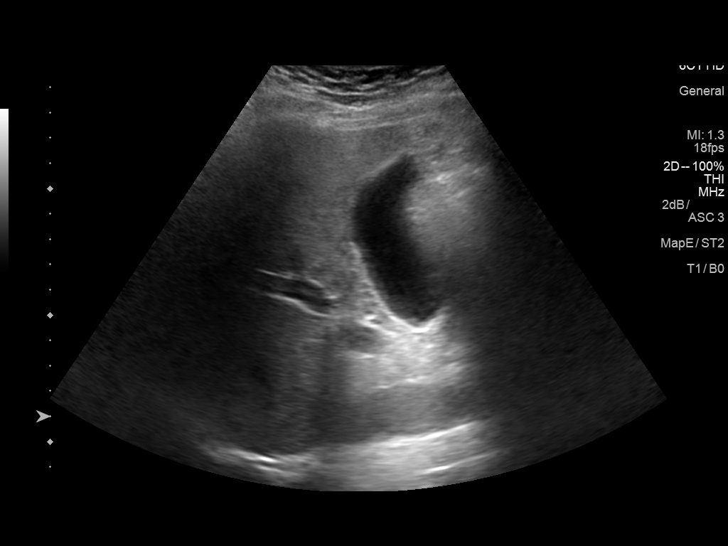
[im 23/35]
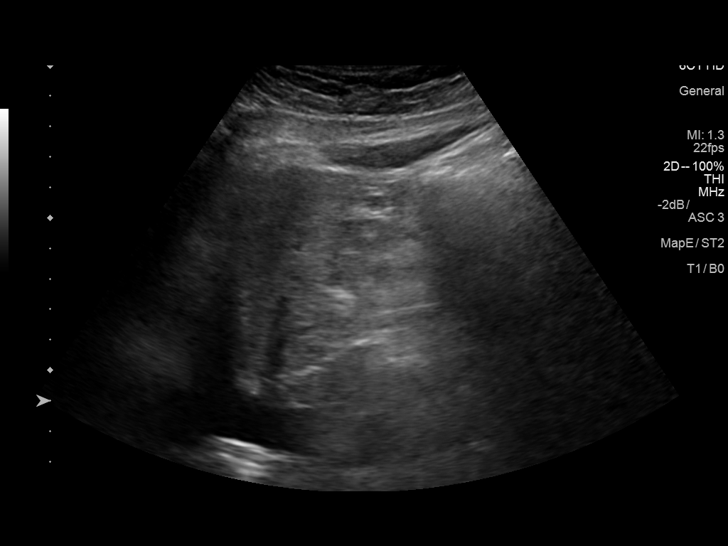
[im 26/35]
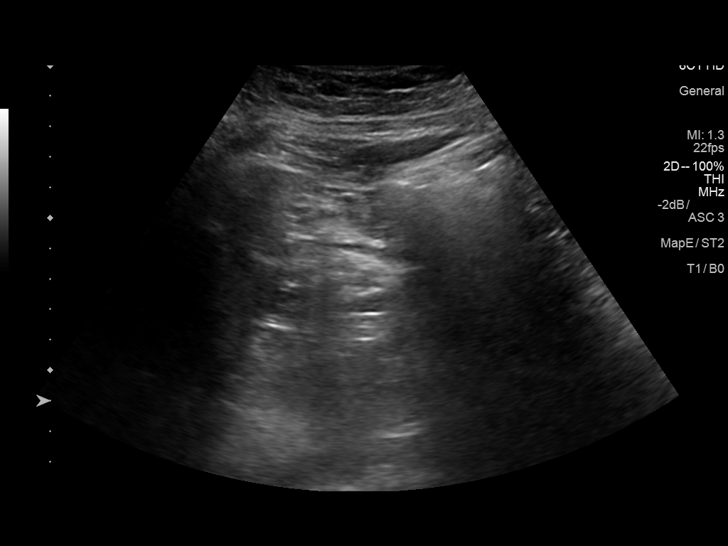
[im 29/35]
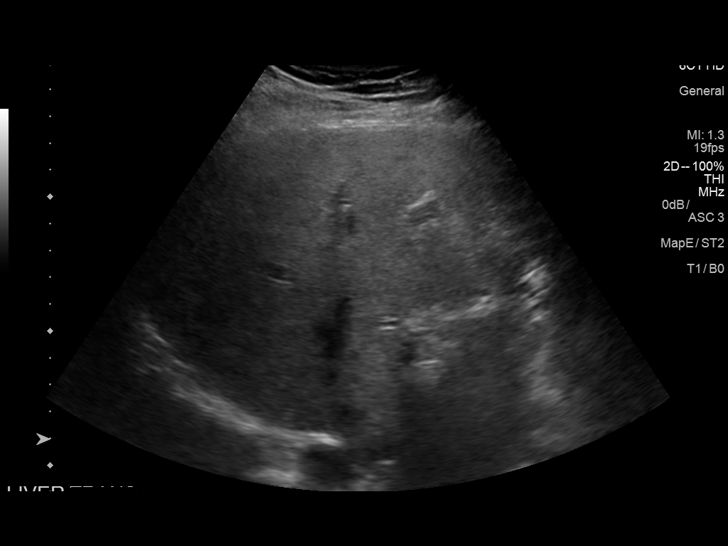
[im 32/35]
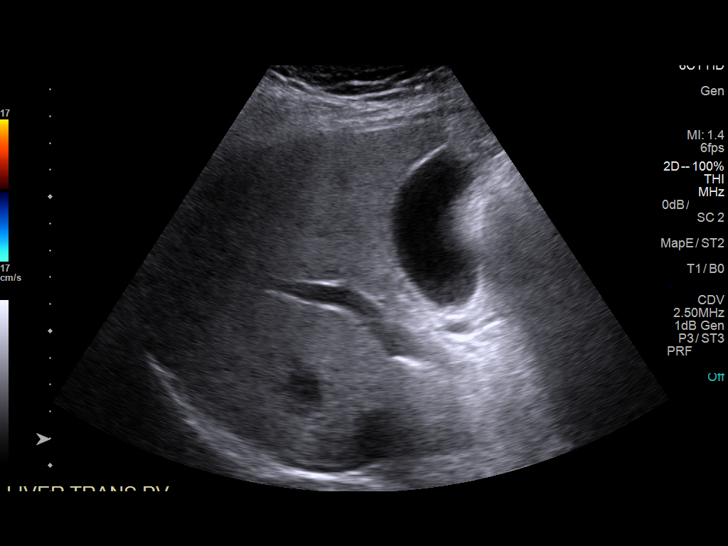
[im 35/35]
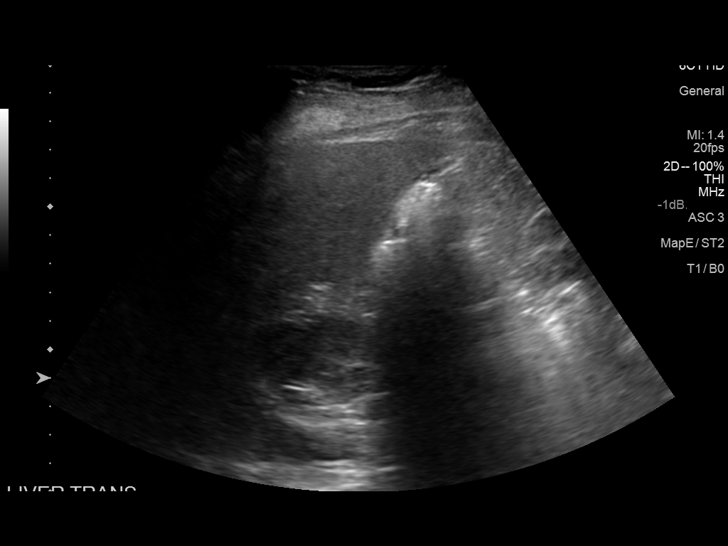

[14 of 25 positions shown; findings below may reference images not displayed]

FINDINGS: Gallbladder:

No gallstones or wall thickening visualized. No sonographic Murphy
sign noted by sonographer.

Common bile duct:

Diameter: Normal caliber, 3 mm

Liver:

Increased echotexture compatible with fatty infiltration. No focal
abnormality or biliary ductal dilatation. Portal vein is patent on
color Doppler imaging with normal direction of blood flow towards
the liver.
IMPRESSION: Mild fatty infiltration of the liver.  No acute findings.

## 2020-01-28 ENCOUNTER — Other Ambulatory Visit: Payer: Self-pay | Admitting: Family Medicine

## 2020-01-28 DIAGNOSIS — E119 Type 2 diabetes mellitus without complications: Secondary | ICD-10-CM

## 2020-02-04 ENCOUNTER — Ambulatory Visit: Payer: Self-pay

## 2020-02-08 ENCOUNTER — Other Ambulatory Visit: Payer: Self-pay | Admitting: Family Medicine

## 2020-04-15 ENCOUNTER — Encounter: Payer: Self-pay | Admitting: Internal Medicine

## 2020-04-15 ENCOUNTER — Other Ambulatory Visit: Payer: Self-pay

## 2020-04-15 ENCOUNTER — Ambulatory Visit (INDEPENDENT_AMBULATORY_CARE_PROVIDER_SITE_OTHER): Payer: BC Managed Care – PPO | Admitting: Internal Medicine

## 2020-04-15 VITALS — BP 128/86 | HR 110 | Temp 98.2°F | Wt 175.0 lb

## 2020-04-15 DIAGNOSIS — E785 Hyperlipidemia, unspecified: Secondary | ICD-10-CM | POA: Diagnosis not present

## 2020-04-15 DIAGNOSIS — E1165 Type 2 diabetes mellitus with hyperglycemia: Secondary | ICD-10-CM | POA: Diagnosis not present

## 2020-04-15 DIAGNOSIS — E1169 Type 2 diabetes mellitus with other specified complication: Secondary | ICD-10-CM | POA: Diagnosis not present

## 2020-04-15 NOTE — Assessment & Plan Note (Signed)
C met and lipid profile today Encouraged him to consume a low-fat diet 

## 2020-04-15 NOTE — Patient Instructions (Signed)

## 2020-04-15 NOTE — Progress Notes (Signed)
HPI  Patient presents to the clinic today to establish care and for management of the conditions listed below. He is transferring care from [facility].  DM2: His last A1c was 14%, 06/2019.  He is prescribed Metformin, Farxiga and Toujeo but he is not taking them as prescribed.  His does not check his sugars.  He does not check his feet routinely.  His last eye exam was in 2019.  HLD: His last LDL was 106, 03/2019.  He is not currently taking any cholesterol-lowering medication.  He does not consume a low-fat diet.  Flu: never Tetanus: 11/2018 Covid: 03/2020 Pneumovax: never Dentist: biannually  Past Medical History:  Diagnosis Date  . Diabetes mellitus, new onset ([place]) 11/27/2018  . Elevated liver function tests 11/27/2018  . Hepatic steatosis 03/19/2019   Mild per [place]    Current Outpatient Medications  Medication Sig Dispense Refill  . FARXIGA 5 MG TABS tablet TAKE 1 TABLET BY MOUTH EVERY DAY 30 tablet 1  . glucose blood (ONETOUCH VERIO) test strip Test twice a day. Pt uses onetouch verio flex meter (Patient not taking: Reported on 07/02/2019) 100 each 2  . Insulin Glargine, 1 Unit Dial, (TOUJEO SOLOSTAR) 300 UNIT/ML SOPN Inject 12 Units into the skin daily. 5 pen 1  . metFORMIN (GLUCOPHAGE) 1000 MG tablet Take 1 tablet (1,000 mg total) by mouth 2 (two) times daily with a meal. 180 tablet 0  . ONETOUCH DELICA LANCETS 51W MISC Test twice a day. Pt uses onetouch verio flex meter (Patient not taking: Reported on 07/02/2019) 100 each 1   No current facility-administered medications for this visit.    No Known Allergies  Family History  Problem Relation Age of Onset  . Diabetes Mother   . Stomach cancer Neg Hx   . Colon cancer Neg Hx   . Pancreatic cancer Neg Hx     Social History   Socioeconomic History  . Marital status: Single    Spouse name: Not on file  . Number of children: 0  . Years of education: Not on file  . Highest education level: Not on file  Occupational History   . Occupation: Therapist, art  Tobacco Use  . Smoking status: Never Smoker  . Smokeless tobacco: Never Used  Vaping Use  . Vaping Use: Never used  Substance and Sexual Activity  . Alcohol use: Not Currently  . Drug use: Never  . Sexual activity: Yes    Partners: Female  Other Topics Concern  . Not on file  Social History Narrative  . Not on file   Social Determinants of Health   Financial Resource Strain:   . Difficulty of Paying Living Expenses:   Food Insecurity:   . Worried About Charity fundraiser in the Last Year:   . Arboriculturist in the Last Year:   Transportation Needs:   . Film/video editor (Medical):   Marland Kitchen Lack of Transportation (Non-Medical):   Physical Activity:   . Days of Exercise per Week:   . Minutes of Exercise per Session:   Stress:   . Feeling of Stress :   Social Connections:   . Frequency of Communication with Friends and Family:   . Frequency of Social Gatherings with Friends and Family:   . Attends Religious Services:   . Active Member of Clubs or Organizations:   . Attends Archivist Meetings:   Marland Kitchen Marital Status:   Intimate Partner Violence:   . Fear of Current or  Ex-Partner:   . Emotionally Abused:   Marland Kitchen Physically Abused:   . Sexually Abused:     ROS:  Constitutional: Denies fever, malaise, fatigue, headache or abrupt weight changes.  HEENT: Pt reports blurred vision. Denies eye pain, eye redness, ear pain, ringing in the ears, wax buildup, runny nose, nasal congestion, bloody nose, or sore throat. Respiratory: Denies difficulty breathing, shortness of breath, cough or sputum production.   Cardiovascular: Denies chest pain, chest tightness, palpitations or swelling in the hands or feet.  Gastrointestinal: Pt reports increased thirst. Denies abdominal pain, bloating, constipation, diarrhea or blood in the stool.  GU: Denies frequency, urgency, pain with urination, blood in urine, odor or discharge. Musculoskeletal: Denies  decrease in range of motion, difficulty with gait, muscle pain or joint pain and swelling.  Skin: Denies redness, rashes, lesions or ulcercations.  Neurological: Denies dizziness, difficulty with memory, difficulty with speech or problems with balance and coordination.  Psych: Denies anxiety, depression, SI/HI.  No other specific complaints in a complete review of systems (except as listed in HPI above).  PE:  BP 128/86   Pulse (!) 110   Temp 98.2 F (36.8 C) (Temporal)   Wt 175 lb (79.4 kg)   SpO2 98%   BMI 27.41 kg/m   Wt Readings from Last 3 Encounters:  07/02/19 173 lb (78.5 kg)  03/19/19 176 lb (79.8 kg)  01/08/19 177 lb (80.3 kg)    General: Appears hisstated age, well developed, well nourished in NAD. HEENT: Head: normal shape and size; Eyes: sclera white, no icterus, conjunctiva pink, PERRLA and EOMs intact;  Neck: Neck supple, trachea midline. No masses, lumps or thyromegaly present.  Cardiovascular: Tachycardic with normal rhythm. S1,S2 noted.  No murmur, rubs or gallops noted. No JVD or BLE edema. No carotid bruits noted. Pulmonary/Chest: Normal effort and positive vesicular breath sounds. No respiratory distress. No wheezes, rales or ronchi noted.  Musculoskeletal: No difficulty with gait.  Neurological: Alert and oriented.  Psychiatric: Mood and affect normal. Behavior is normal. Judgment and thought content normal.     BMET    Component Value Date/Time   NA 138 07/02/2019 1215   K 4.3 07/02/2019 1215   CL 97 07/02/2019 1215   CO2 23 07/02/2019 1215   GLUCOSE 278 (H) 07/02/2019 1215   BUN 10 07/02/2019 1215   CREATININE 0.58 (L) 07/02/2019 1215   CALCIUM 9.0 07/02/2019 1215   GFRNONAA 139 07/02/2019 1215   GFRAA 160 07/02/2019 1215    Lipid Panel     Component Value Date/Time   CHOL 187 03/19/2019 1145   TRIG 141 03/19/2019 1145   HDL 53 03/19/2019 1145   CHOLHDL 3.5 03/19/2019 1145   LDLCALC 106 (H) 03/19/2019 1145    CBC    Component Value  Date/Time   WBC 4.5 07/02/2019 1215   RBC 5.65 07/02/2019 1215   HGB 16.9 07/02/2019 1215   HCT 49.5 07/02/2019 1215   PLT 185 07/02/2019 1215   MCV 88 07/02/2019 1215   MCH 29.9 07/02/2019 1215   MCHC 34.1 07/02/2019 1215   RDW 12.4 07/02/2019 1215   LYMPHSABS 1.7 07/02/2019 1215   EOSABS 0.0 07/02/2019 1215   BASOSABS 0.0 07/02/2019 1215    Hgb A1C Lab Results  Component Value Date   HGBA1C 14.0 (A) 07/02/2019     Assessment and Plan:   Nicki Reaper, NP This visit occurred during the SARS-CoV-2 public health emergency.  Safety protocols were in place, including screening questions prior to the  visit, additional usage of staff PPE, and extensive cleaning of exam room while observing appropriate contact time as indicated for disinfecting solutions.

## 2020-04-15 NOTE — Assessment & Plan Note (Addendum)
CBC, C met, lipid, A1c and urine microalbumin today Encouraged him to consume a low carb diet We will likely start Glipizide 10 mg twice daily He has a scheduled eye exam Encouraged routine foot exams He declines flu or Pneumovax today Referral to diabetes education and nutrition  We will follow-up after labs, RTC in 3 months for annual exam

## 2020-04-16 ENCOUNTER — Telehealth: Payer: Self-pay | Admitting: Radiology

## 2020-04-16 LAB — COMPREHENSIVE METABOLIC PANEL
ALT: 153 U/L — ABNORMAL HIGH (ref 0–53)
AST: 57 U/L — ABNORMAL HIGH (ref 0–37)
Albumin: 4.5 g/dL (ref 3.5–5.2)
Alkaline Phosphatase: 204 U/L — ABNORMAL HIGH (ref 39–117)
BUN: 12 mg/dL (ref 6–23)
CO2: 29 mEq/L (ref 19–32)
Calcium: 9.6 mg/dL (ref 8.4–10.5)
Chloride: 95 mEq/L — ABNORMAL LOW (ref 96–112)
Creatinine, Ser: 0.78 mg/dL (ref 0.40–1.50)
GFR: 117.79 mL/min (ref >60.00)
Glucose, Bld: 560 mg/dL (ref 70–99)
Potassium: 4.5 mEq/L (ref 3.5–5.1)
Sodium: 133 mEq/L — ABNORMAL LOW (ref 135–145)
Total Bilirubin: 0.9 mg/dL (ref 0.2–1.2)
Total Protein: 7 g/dL (ref 6.0–8.3)

## 2020-04-16 LAB — CBC
HCT: 45.2 % (ref 39.0–52.0)
Hemoglobin: 15.9 g/dL (ref 13.0–17.0)
MCHC: 35.1 g/dL (ref 30.0–36.0)
MCV: 89 fl (ref 78.0–100.0)
Platelets: 213 10*3/uL (ref 150.0–400.0)
RBC: 5.08 Mil/uL (ref 4.22–5.81)
RDW: 12.7 % (ref 11.5–15.5)
WBC: 7.9 10*3/uL (ref 4.0–10.5)

## 2020-04-16 LAB — MICROALBUMIN / CREATININE URINE RATIO
Creatinine,U: 30.7 mg/dL
Microalb Creat Ratio: 2.9 mg/g (ref 0.0–30.0)
Microalb, Ur: 0.9 mg/dL (ref 0.0–1.9)

## 2020-04-16 LAB — LIPID PANEL
Cholesterol: 282 mg/dL — ABNORMAL HIGH (ref 0–200)
HDL: 47.9 mg/dL (ref >39.00)
Total CHOL/HDL Ratio: 6
Triglycerides: 525 mg/dL — ABNORMAL HIGH (ref 0.0–149.0)

## 2020-04-16 LAB — HEMOGLOBIN A1C: Hgb A1c MFr Bld: 14 % — ABNORMAL HIGH (ref 4.6–6.5)

## 2020-04-16 LAB — LDL CHOLESTEROL, DIRECT: Direct LDL: 177 mg/dL

## 2020-04-16 NOTE — Telephone Encounter (Signed)
[facility] called a critical Glucose, 560. Results given to [facility]

## 2020-04-16 NOTE — Telephone Encounter (Signed)
Noted, uncontrolled diabetic off meds. Will restart oral meds.

## 2020-04-18 ENCOUNTER — Encounter: Payer: Self-pay | Admitting: Internal Medicine

## 2020-04-18 MED ORDER — GLIPIZIDE 10 MG PO TABS
10.0000 mg | ORAL_TABLET | Freq: Two times a day (BID) | ORAL | 0 refills | Status: DC
Start: 2020-04-18 — End: 2020-06-26

## 2020-06-16 ENCOUNTER — Other Ambulatory Visit: Payer: Self-pay

## 2020-06-16 ENCOUNTER — Encounter: Payer: BC Managed Care – PPO | Attending: Internal Medicine | Admitting: Dietician

## 2020-06-16 ENCOUNTER — Encounter: Payer: Self-pay | Admitting: Dietician

## 2020-06-16 VITALS — Ht 68.0 in | Wt 181.0 lb

## 2020-06-16 DIAGNOSIS — E1165 Type 2 diabetes mellitus with hyperglycemia: Secondary | ICD-10-CM

## 2020-06-16 DIAGNOSIS — E785 Hyperlipidemia, unspecified: Secondary | ICD-10-CM | POA: Diagnosis not present

## 2020-06-16 DIAGNOSIS — E1169 Type 2 diabetes mellitus with other specified complication: Secondary | ICD-10-CM

## 2020-06-16 NOTE — Progress Notes (Signed)
Medical Nutrition Therapy: Visit start time: 1400  end time: 1530  Assessment:  Diagnosis: Type 2 Diabetes, hyperlipidemia secondary to diabetes Past medical history: none significant Psychosocial issues/ stress concerns: none  Preferred learning method:  . Auditory . Visual . Hands-on   Current weight: 181.0lbs Height: 5'8" Medications, supplements: glipizide  Progress and evaluation:   Patient reports having some hyperglycemia symptoms in 2019, including excessive thirst and urination, and was diagnosed with diabetes.  He states he has not been taking care of his diabetes, went for annual checkup this year and HbA1C was 14%.    He is checking BGs sporadically, and reports recent fasting bgs 120-150; 3 hrs after evening meal 180-200s  Patient states he has followed healthy eating pattern in the past, did advanced meal prepping and exercised regularly at gym. He currently works sedentary job, and work hours changed making exercise and meal prep more difficult.  Mother lives with patient and also has diabetes.   Physical activity: 1 hour once a week, not regularly  Dietary Intake:  Usual eating pattern includes 3 meals and 0-1 snacks per day. Dining out frequency: 10 meals per week.  Breakfast: 10am PB sandwich or egg salad; oatmeal; frozen biscuit + apple or orange juice, occ coffee Snack: none Lunch: 2pm -- usually out -- burger with green beans; tenderloin, corn, green beans; rice bowl with spicy chicken and cheese, corn, beans; chick fila sandwich occ grilled; [employer] tuna sub; [employer] [place] or chicken sandwich occ with chips Snack: occasionally chips maybe 1-2 times a week; usually just drinks water or juice Supper: taco bell or zaxby's or hispanic food Snack: none Beverages: water, juices, soda or sweet tea when out, coffee  Nutrition Care Education: Topics covered:  Basic nutrition: basic food groups, appropriate nutrient balance, appropriate meal and snack  schedule, general nutrition guidelines    Advanced nutrition: dining out --resources for restaurant meal nutrition, food label reading for carbohydrate; resources for meal planning and recipes Diabetes:  appropriate meal and snack schedule, appropriate carb intake and balance, healthy carb choices, role of fiber, protein, fat; basic meal planning using plate method and food lists; provided structured meal plan for 1700kcal with 45% CHO, 25% pro, and 30% fat, and wrote individualized menus based on patient's current intake Hyperlipidemia:  Limiting highly processed foods and carb portions; limiting high fat foods and added fats   Nutritional Diagnosis:  [place]-2.2 Altered nutrition-related laboratory As related to Type 2 diabetes.  As evidenced by recent HbA1C of 14%.  Intervention:   Instruction and discussion as noted above.  Patient voices motivation to work on healthy lifestyle changes.   Established goals for change with direction from patient.  Education Materials given:  . General diet guidelines for Diabetes . Plate Planner with food lists . Sample meal pattern . Sample menus . Goals/ instructions   Learner/ who was taught:  . Patient    Level of understanding: Marland Kitchen Verbalizes/ demonstrates competency   Demonstrated degree of understanding via:   Teach back Learning barriers: . None   Willingness to learn/ readiness for change: . Acceptance, ready for change   Monitoring and Evaluation:  Dietary intake, exercise, BG control, and body weight      follow up: 09/01/20 at 1:30pm

## 2020-06-16 NOTE — Patient Instructions (Signed)
   Choose baked and grilled foods rather than fried. Stir fried is ok.   Drink sugar free drinks including juices,  Continue to limit sweets, control fruit portions and increase low-carb veggies.   Try Yummly app for diabetic diet meal and recipe options, includes grocery lists.   Use Calorieking.com to look up restaurant foods or look up the individual restaurant website.

## 2020-06-24 ENCOUNTER — Other Ambulatory Visit: Payer: Self-pay | Admitting: Internal Medicine

## 2020-06-26 MED ORDER — GLIPIZIDE 10 MG PO TABS: 10.0000 mg | ORAL_TABLET | Freq: Two times a day (BID) | ORAL | 0 refills | Status: DC

## 2020-06-26 NOTE — Telephone Encounter (Signed)
Pt has scheduled CPE for 08/2020

## 2020-06-26 NOTE — Addendum Note (Signed)
Addended by: Roena Malady on: 06/26/2020 02:56 PM   Modules accepted: Orders

## 2020-07-08 DIAGNOSIS — Z03818 Encounter for observation for suspected exposure to other biological agents ruled out: Secondary | ICD-10-CM | POA: Diagnosis not present

## 2020-07-22 DIAGNOSIS — H5213 Myopia, bilateral: Secondary | ICD-10-CM | POA: Diagnosis not present

## 2020-08-26 ENCOUNTER — Other Ambulatory Visit: Payer: Self-pay

## 2020-08-26 ENCOUNTER — Encounter: Payer: Self-pay | Admitting: Internal Medicine

## 2020-08-26 ENCOUNTER — Ambulatory Visit (INDEPENDENT_AMBULATORY_CARE_PROVIDER_SITE_OTHER): Payer: BC Managed Care – PPO | Admitting: Internal Medicine

## 2020-08-26 VITALS — BP 124/82 | HR 87 | Temp 97.8°F | Ht 67.0 in | Wt 182.0 lb

## 2020-08-26 DIAGNOSIS — Z0000 Encounter for general adult medical examination without abnormal findings: Secondary | ICD-10-CM | POA: Diagnosis not present

## 2020-08-26 DIAGNOSIS — Z23 Encounter for immunization: Secondary | ICD-10-CM | POA: Diagnosis not present

## 2020-08-26 DIAGNOSIS — E785 Hyperlipidemia, unspecified: Secondary | ICD-10-CM | POA: Diagnosis not present

## 2020-08-26 DIAGNOSIS — E1169 Type 2 diabetes mellitus with other specified complication: Secondary | ICD-10-CM | POA: Diagnosis not present

## 2020-08-26 DIAGNOSIS — E1165 Type 2 diabetes mellitus with hyperglycemia: Secondary | ICD-10-CM

## 2020-08-26 LAB — COMPREHENSIVE METABOLIC PANEL
ALT: 204 U/L — ABNORMAL HIGH (ref 0–53)
AST: 66 U/L — ABNORMAL HIGH (ref 0–37)
Albumin: 4.2 g/dL (ref 3.5–5.2)
Alkaline Phosphatase: 172 U/L — ABNORMAL HIGH (ref 39–117)
BUN: 11 mg/dL (ref 6–23)
CO2: 30 mEq/L (ref 19–32)
Calcium: 9.4 mg/dL (ref 8.4–10.5)
Chloride: 97 mEq/L (ref 96–112)
Creatinine, Ser: 0.62 mg/dL (ref 0.40–1.50)
GFR: 129.42 mL/min (ref >60.00)
Glucose, Bld: 290 mg/dL — ABNORMAL HIGH (ref 70–99)
Potassium: 3.9 mEq/L (ref 3.5–5.1)
Sodium: 135 mEq/L (ref 135–145)
Total Bilirubin: 1.1 mg/dL (ref 0.2–1.2)
Total Protein: 6.6 g/dL (ref 6.0–8.3)

## 2020-08-26 LAB — CBC
HCT: 44.7 % (ref 39.0–52.0)
Hemoglobin: 15.5 g/dL (ref 13.0–17.0)
MCHC: 34.8 g/dL (ref 30.0–36.0)
MCV: 86.4 fl (ref 78.0–100.0)
Platelets: 199 10*3/uL (ref 150.0–400.0)
RBC: 5.18 Mil/uL (ref 4.22–5.81)
RDW: 13.2 % (ref 11.5–15.5)
WBC: 6.4 10*3/uL (ref 4.0–10.5)

## 2020-08-26 LAB — MICROALBUMIN / CREATININE URINE RATIO
Creatinine,U: 91.3 mg/dL
Microalb Creat Ratio: 2.2 mg/g (ref 0.0–30.0)
Microalb, Ur: 2 mg/dL — ABNORMAL HIGH (ref 0.0–1.9)

## 2020-08-26 LAB — LIPID PANEL
Cholesterol: 219 mg/dL — ABNORMAL HIGH (ref 0–200)
HDL: 52 mg/dL (ref >39.00)
NonHDL: 166.57
Total CHOL/HDL Ratio: 4
Triglycerides: 201 mg/dL — ABNORMAL HIGH (ref 0.0–149.0)
VLDL: 40.2 mg/dL — ABNORMAL HIGH (ref 0.0–40.0)

## 2020-08-26 LAB — LDL CHOLESTEROL, DIRECT: Direct LDL: 147 mg/dL

## 2020-08-26 LAB — HEMOGLOBIN A1C: Hgb A1c MFr Bld: 12.3 % — ABNORMAL HIGH (ref 4.6–6.5)

## 2020-08-26 NOTE — Progress Notes (Signed)
Subjective:    Patient ID: Sean Gonzalez, male    DOB: Nov 11, 1990, 29 y.o.   MRN: [id number]  HPI  Pt presents to the clinic today for his annual exam. He is also due to follow up chronic conditions.  HLD: His last LDL was 177, triglycerides 525, 04/2020. He is not taking any cholesterol lowering medication at this time. He does not consume a low fat diet.  DM2: His last A1C was 14%. He was started on Glipizide 10 mg BID. He has been taking the medication as prescribed. He is not checking his sugars. He does not routinely check his feet. His last eye exam was 07/2020.  Flu: never Tetanus: 11/2018 Pneumovax: never Covid: [place] Vision Screening: annually Dentist: biannually  Diet: He does eat meat. He consumes fruits and veggies daily. He does eat some fried foods. He drinks mostly water, gatorade zero, coffee, some soda. Exercise: None  Review of Systems      Past Medical History:  Diagnosis Date  . Diabetes mellitus, new onset ([facility]) 11/27/2018  . Elevated liver function tests 11/27/2018  . Hepatic steatosis 03/19/2019   Mild per [place]    Current Outpatient Medications  Medication Sig Dispense Refill  . glipiZIDE (GLUCOTROL) 10 MG tablet Take 1 tablet (10 mg total) by mouth 2 (two) times daily before a meal. 180 tablet 0   No current facility-administered medications for this visit.    No Known Allergies  Family History  Problem Relation Age of Onset  . Diabetes Mother   . Stomach cancer Neg Hx   . Colon cancer Neg Hx   . Pancreatic cancer Neg Hx     Social History   Socioeconomic History  . Marital status: Single    Spouse name: Not on file  . Number of children: 0  . Years of education: Not on file  . Highest education level: Not on file  Occupational History  . Occupation: Clinical biochemist  Tobacco Use  . Smoking status: Never Smoker  . Smokeless tobacco: Never Used  Vaping Use  . Vaping Use: Never used  Substance and Sexual Activity  . Alcohol  use: Not Currently  . Drug use: Never  . Sexual activity: Yes    Partners: Female  Other Topics Concern  . Not on file  Social History Narrative  . Not on file   Social Determinants of Health   Financial Resource Strain:   . Difficulty of Paying Living Expenses: Not on file  Food Insecurity:   . Worried About Programme researcher, broadcasting/film/video in the Last Year: Not on file  . Ran Out of Food in the Last Year: Not on file  Transportation Needs:   . Lack of Transportation (Medical): Not on file  . Lack of Transportation (Non-Medical): Not on file  Physical Activity:   . Days of Exercise per Week: Not on file  . Minutes of Exercise per Session: Not on file  Stress:   . Feeling of Stress : Not on file  Social Connections:   . Frequency of Communication with Friends and Family: Not on file  . Frequency of Social Gatherings with Friends and Family: Not on file  . Attends Religious Services: Not on file  . Active Member of Clubs or Organizations: Not on file  . Attends Banker Meetings: Not on file  . Marital Status: Not on file  Intimate Partner Violence:   . Fear of Current or Ex-Partner: Not on file  . Emotionally  Abused: Not on file  . Physically Abused: Not on file  . Sexually Abused: Not on file     Constitutional: Denies fever, malaise, fatigue, headache or abrupt weight changes.  HEENT: Denies eye pain, eye redness, ear pain, ringing in the ears, wax buildup, runny nose, nasal congestion, bloody nose, or sore throat. Respiratory: Denies difficulty breathing, shortness of breath, cough or sputum production.   Cardiovascular: Denies chest pain, chest tightness, palpitations or swelling in the hands or feet.  Gastrointestinal: Denies abdominal pain, bloating, constipation, diarrhea or blood in the stool.  GU: Denies urgency, frequency, pain with urination, burning sensation, blood in urine, odor or discharge. Musculoskeletal: Denies decrease in range of motion, difficulty  with gait, muscle pain or joint pain and swelling.  Skin: Denies redness, rashes, lesions or ulcercations.  Neurological: Denies dizziness, difficulty with memory, difficulty with speech or problems with balance and coordination.  Psych: Denies anxiety, depression, SI/HI.  No other specific complaints in a complete review of systems (except as listed in HPI above).  Objective:   Physical Exam  BP 124/82   Pulse 87   Temp 97.8 F (36.6 C) (Temporal)   Ht 5\' 7"  (1.702 m)   Wt 182 lb (82.6 kg)   SpO2 98%   BMI 28.51 kg/m   Wt Readings from Last 3 Encounters:  06/16/20 181 lb (82.1 kg)  04/15/20 175 lb (79.4 kg)  07/02/19 173 lb (78.5 kg)    General: Appears his stated age, well developed, well nourished in NAD. Skin: Warm, dry and intact. No rashes, lesions or ulcerations noted. HEENT: Head: normal shape and size; Eyes: sclera white, no icterus, conjunctiva pink, PERRLA and EOMs intact;  Neck:  Neck supple, trachea midline. No masses, lumps or thyromegaly present.  Cardiovascular: Normal rate and rhythm. S1,S2 noted.  No murmur, rubs or gallops noted. No JVD or BLE edema.  Pulmonary/Chest: Normal effort and positive vesicular breath sounds. No respiratory distress. No wheezes, rales or ronchi noted.  Abdomen: Soft and nontender. Normal bowel sounds. No distention or masses noted. Liver, spleen and kidneys non palpable. Musculoskeletal: Strength 5/5 BUE/BLE. No difficulty with gait.  Neurological: Alert and oriented. Cranial nerves II-XII grossly intact. Coordination normal.  Psychiatric: Mood and affect normal. Behavior is normal. Judgment and thought content normal.    BMET    Component Value Date/Time   NA 133 (L) 04/15/2020 1512   NA 138 07/02/2019 1215   K 4.5 04/15/2020 1512   CL 95 (L) 04/15/2020 1512   CO2 29 04/15/2020 1512   GLUCOSE 560 (HH) 04/15/2020 1512   BUN 12 04/15/2020 1512   BUN 10 07/02/2019 1215   CREATININE 0.78 04/15/2020 1512   CALCIUM 9.6  04/15/2020 1512   GFRNONAA 139 07/02/2019 1215   GFRAA 160 07/02/2019 1215    Lipid Panel     Component Value Date/Time   CHOL 282 (H) 04/15/2020 1512   CHOL 187 03/19/2019 1145   TRIG (H) 04/15/2020 1512    525.0 Triglyceride is over 400; calculations on Lipids are invalid.   HDL 47.90 04/15/2020 1512   HDL 53 03/19/2019 1145   CHOLHDL 6 04/15/2020 1512   LDLCALC 106 (H) 03/19/2019 1145    CBC    Component Value Date/Time   WBC 7.9 04/15/2020 1512   RBC 5.08 04/15/2020 1512   HGB 15.9 04/15/2020 1512   HGB 16.9 07/02/2019 1215   HCT 45.2 04/15/2020 1512   HCT 49.5 07/02/2019 1215   PLT 213.0 04/15/2020 1512  PLT 185 07/02/2019 1215   MCV 89.0 04/15/2020 1512   MCV 88 07/02/2019 1215   MCH 29.9 07/02/2019 1215   MCHC 35.1 04/15/2020 1512   RDW 12.7 04/15/2020 1512   RDW 12.4 07/02/2019 1215   LYMPHSABS 1.7 07/02/2019 1215   EOSABS 0.0 07/02/2019 1215   BASOSABS 0.0 07/02/2019 1215    Hgb A1C Lab Results  Component Value Date   HGBA1C 14.0 (H) 04/15/2020            Assessment & Plan:   Preventative Health Maintenance:  He declines flu shot today Tetanus UTD Pneumovax today Covid UTD Encouraged him to consume a balanced diet and exercise regimen Advised him to see an eye doctor and dentist annually Will check CBC, CMET, Lipid, A1C and urine microalbumin today  RTC in 3 months, lab only Nicki Reaper, NP This visit occurred during the SARS-CoV-2 public health emergency.  Safety protocols were in place, including screening questions prior to the visit, additional usage of staff PPE, and extensive cleaning of exam room while observing appropriate contact time as indicated for disinfecting solutions.

## 2020-08-26 NOTE — Assessment & Plan Note (Signed)
CBC, CMET, Lipid, A1C and urine microalbumin today Encouraged him to consume a low carb diet Continue Glipizide 10 mg BID If A1C> 8 will need another agent Encouraged routine eye exams- will request copy Encouraged routine foot exams He declines flu shot  Covid vaccine UTD Pneumovax today

## 2020-08-26 NOTE — Assessment & Plan Note (Signed)
CMET and lipid profile today Encouraged him to consume a low fat diet If LDL> 100, triglycerides > 004, will need statin therapy

## 2020-08-26 NOTE — Patient Instructions (Signed)

## 2020-09-01 ENCOUNTER — Ambulatory Visit: Payer: BC Managed Care – PPO | Admitting: Dietician

## 2020-09-11 MED ORDER — LISINOPRIL 5 MG PO TABS: 5.0000 mg | ORAL_TABLET | Freq: Every day | ORAL | 0 refills | Status: DC

## 2020-09-11 NOTE — Addendum Note (Signed)
Addended by: Roena Malady on: 09/11/2020 01:27 PM   Modules accepted: Orders

## 2020-09-26 ENCOUNTER — Other Ambulatory Visit: Payer: Self-pay | Admitting: Internal Medicine

## 2020-10-08 ENCOUNTER — Encounter: Payer: Self-pay | Admitting: Dietician

## 2020-10-08 NOTE — Progress Notes (Signed)
Have not heard back from patient after his missed appointment on 09/01/20. Sent notification to referring provider.

## 2020-10-13 ENCOUNTER — Other Ambulatory Visit: Payer: Self-pay | Admitting: Internal Medicine

## 2020-11-07 ENCOUNTER — Ambulatory Visit: Payer: BC Managed Care – PPO | Admitting: Internal Medicine

## 2020-11-24 ENCOUNTER — Ambulatory Visit: Payer: BC Managed Care – PPO | Admitting: Internal Medicine

## 2020-12-01 ENCOUNTER — Other Ambulatory Visit: Payer: Self-pay

## 2020-12-01 ENCOUNTER — Ambulatory Visit: Payer: BC Managed Care – PPO | Admitting: Internal Medicine

## 2020-12-01 DIAGNOSIS — Z0289 Encounter for other administrative examinations: Secondary | ICD-10-CM

## 2020-12-01 NOTE — Progress Notes (Deleted)
Subjective:    Patient ID: Sean Gonzalez, male    DOB: [birth year], 30 y.o.   MRN: [id number]  HPI  Pt presents to the clinic today for 3 month follow up of DM 2 and HLD.  DM 2: His last A1C was 12.3, 08/2020. He is taking Glipizide as prescribed. He is on Lisinopril for renal protection. He does not check his sugars or his feet routinely. His last eye exam was. Flu never. Pneumovax 08/2020. Covid Jannsen.  HLD: His last LDL was 143, triglycerides 207, 08/2020. He is not currently taking any cholesterol lowering medication. He does not consume a low fat diet.   Review of Systems      Past Medical History:  Diagnosis Date  . Diabetes mellitus, new onset (HCC) 11/27/2018  . Elevated liver function tests 11/27/2018  . Hepatic steatosis 03/19/2019   Mild per [place]    Current Outpatient Medications  Medication Sig Dispense Refill  . glipiZIDE (GLUCOTROL) 10 MG tablet TAKE 1 TABLET (10 MG TOTAL) BY MOUTH 2 (TWO) TIMES DAILY BEFORE A MEAL. 60 tablet 0  . lisinopril (ZESTRIL) 5 MG tablet Take 1 tablet (5 mg total) by mouth daily. 90 tablet 0   No current facility-administered medications for this visit.    No Known Allergies  Family History  Problem Relation Age of Onset  . Diabetes Mother   . Stomach cancer Neg Hx   . Colon cancer Neg Hx   . Pancreatic cancer Neg Hx     Social History   Socioeconomic History  . Marital status: Single    Spouse name: Not on file  . Number of children: 0  . Years of education: Not on file  . Highest education level: Not on file  Occupational History  . Occupation: Clinical biochemist  Tobacco Use  . Smoking status: Never Smoker  . Smokeless tobacco: Never Used  Vaping Use  . Vaping Use: Never used  Substance and Sexual Activity  . Alcohol use: Not Currently  . Drug use: Never  . Sexual activity: Yes    Partners: Female  Other Topics Concern  . Not on file  Social History Narrative  . Not on file   Social Determinants of  Health   Financial Resource Strain: Not on file  Food Insecurity: Not on file  Transportation Needs: Not on file  Physical Activity: Not on file  Stress: Not on file  Social Connections: Not on file  Intimate Partner Violence: Not on file     Constitutional: Denies fever, malaise, fatigue, headache or abrupt weight changes.  HEENT: Denies eye pain, eye redness, ear pain, ringing in the ears, wax buildup, runny nose, nasal congestion, bloody nose, or sore throat. Respiratory: Denies difficulty breathing, shortness of breath, cough or sputum production.   Cardiovascular: Denies chest pain, chest tightness, palpitations or swelling in the hands or feet.  Gastrointestinal: Denies abdominal pain, bloating, constipation, diarrhea or blood in the stool.  GU: Denies urgency, frequency, pain with urination, burning sensation, blood in urine, odor or discharge. Musculoskeletal: Denies decrease in range of motion, difficulty with gait, muscle pain or joint pain and swelling.  Skin: Denies redness, rashes, lesions or ulcercations.  Neurological: Denies dizziness, difficulty with memory, difficulty with speech or problems with balance and coordination.  Psych: Denies anxiety, depression, SI/HI.  No other specific complaints in a complete review of systems (except as listed in HPI above).  Objective:   Physical Exam  There were no vitals taken for this  visit. Wt Readings from Last 3 Encounters:  08/26/20 182 lb (82.6 kg)  06/16/20 181 lb (82.1 kg)  04/15/20 175 lb (79.4 kg)    General: Appears their stated age, well developed, well nourished in NAD. Skin: Warm, dry and intact. No rashes, lesions or ulcerations noted. HEENT: Head: normal shape and size; Eyes: sclera white, no icterus, conjunctiva pink, PERRLA and EOMs intact; Ears: Tm's gray and intact, normal light reflex; Nose: mucosa pink and moist, septum midline; Throat/Mouth: Teeth present, mucosa pink and moist, no exudate, lesions or  ulcerations noted.  Neck:  Neck supple, trachea midline. No masses, lumps or thyromegaly present.  Cardiovascular: Normal rate and rhythm. S1,S2 noted.  No murmur, rubs or gallops noted. No JVD or BLE edema. No carotid bruits noted. Pulmonary/Chest: Normal effort and positive vesicular breath sounds. No respiratory distress. No wheezes, rales or ronchi noted.  Abdomen: Soft and nontender. Normal bowel sounds. No distention or masses noted. Liver, spleen and kidneys non palpable. Musculoskeletal: Normal range of motion. No signs of joint swelling. No difficulty with gait.  Neurological: Alert and oriented. Cranial nerves II-XII grossly intact. Coordination normal.  Psychiatric: Mood and affect normal. Behavior is normal. Judgment and thought content normal.   EKG:  BMET    Component Value Date/Time   NA 135 08/26/2020 1104   NA 138 07/02/2019 1215   K 3.9 08/26/2020 1104   CL 97 08/26/2020 1104   CO2 30 08/26/2020 1104   GLUCOSE 290 (H) 08/26/2020 1104   BUN 11 08/26/2020 1104   BUN 10 07/02/2019 1215   CREATININE 0.62 08/26/2020 1104   CALCIUM 9.4 08/26/2020 1104   GFRNONAA 139 07/02/2019 1215   GFRAA 160 07/02/2019 1215    Lipid Panel     Component Value Date/Time   CHOL 219 (H) 08/26/2020 1104   CHOL 187 03/19/2019 1145   TRIG 201.0 (H) 08/26/2020 1104   HDL 52.00 08/26/2020 1104   HDL 53 03/19/2019 1145   CHOLHDL 4 08/26/2020 1104   VLDL 40.2 (H) 08/26/2020 1104   LDLCALC 106 (H) 03/19/2019 1145    CBC    Component Value Date/Time   WBC 6.4 08/26/2020 1104   RBC 5.18 08/26/2020 1104   HGB 15.5 08/26/2020 1104   HGB 16.9 07/02/2019 1215   HCT 44.7 08/26/2020 1104   HCT 49.5 07/02/2019 1215   PLT 199.0 08/26/2020 1104   PLT 185 07/02/2019 1215   MCV 86.4 08/26/2020 1104   MCV 88 07/02/2019 1215   MCH 29.9 07/02/2019 1215   MCHC 34.8 08/26/2020 1104   RDW 13.2 08/26/2020 1104   RDW 12.4 07/02/2019 1215   LYMPHSABS 1.7 07/02/2019 1215   EOSABS 0.0 07/02/2019  1215   BASOSABS 0.0 07/02/2019 1215    Hgb A1C Lab Results  Component Value Date   HGBA1C 12.3 (H) 08/26/2020            Assessment & Plan:    Nicki Reaper, NP This visit occurred during the SARS-CoV-2 public health emergency.  Safety protocols were in place, including screening questions prior to the visit, additional usage of staff PPE, and extensive cleaning of exam room while observing appropriate contact time as indicated for disinfecting solutions.

## 2021-01-06 ENCOUNTER — Other Ambulatory Visit: Payer: Self-pay | Admitting: Internal Medicine

## 2021-02-06 ENCOUNTER — Other Ambulatory Visit: Payer: Self-pay | Admitting: Internal Medicine

## 2021-03-10 ENCOUNTER — Other Ambulatory Visit: Payer: Self-pay | Admitting: Internal Medicine

## 2021-03-15 DIAGNOSIS — L03115 Cellulitis of right lower limb: Secondary | ICD-10-CM | POA: Diagnosis not present

## 2021-03-15 DIAGNOSIS — L089 Local infection of the skin and subcutaneous tissue, unspecified: Secondary | ICD-10-CM | POA: Diagnosis not present

## 2021-03-15 DIAGNOSIS — S90821A Blister (nonthermal), right foot, initial encounter: Secondary | ICD-10-CM | POA: Diagnosis not present

## 2021-06-22 ENCOUNTER — Other Ambulatory Visit: Payer: Self-pay | Admitting: Family

## 2021-07-17 ENCOUNTER — Ambulatory Visit (INDEPENDENT_AMBULATORY_CARE_PROVIDER_SITE_OTHER): Payer: BC Managed Care – PPO | Admitting: Nurse Practitioner

## 2021-07-17 ENCOUNTER — Encounter: Payer: Self-pay | Admitting: Nurse Practitioner

## 2021-07-17 ENCOUNTER — Other Ambulatory Visit: Payer: Self-pay

## 2021-07-17 VITALS — BP 134/96 | HR 102 | Temp 98.0°F | Resp 10 | Ht 67.0 in | Wt 170.4 lb

## 2021-07-17 DIAGNOSIS — E1169 Type 2 diabetes mellitus with other specified complication: Secondary | ICD-10-CM

## 2021-07-17 DIAGNOSIS — E785 Hyperlipidemia, unspecified: Secondary | ICD-10-CM

## 2021-07-17 DIAGNOSIS — E1165 Type 2 diabetes mellitus with hyperglycemia: Secondary | ICD-10-CM

## 2021-07-17 LAB — COMPREHENSIVE METABOLIC PANEL
ALT: 118 U/L — ABNORMAL HIGH (ref 0–53)
AST: 47 U/L — ABNORMAL HIGH (ref 0–37)
Albumin: 4.5 g/dL (ref 3.5–5.2)
Alkaline Phosphatase: 165 U/L — ABNORMAL HIGH (ref 39–117)
BUN: 16 mg/dL (ref 6–23)
CO2: 32 mEq/L (ref 19–32)
Calcium: 10 mg/dL (ref 8.4–10.5)
Chloride: 94 mEq/L — ABNORMAL LOW (ref 96–112)
Creatinine, Ser: 0.65 mg/dL (ref 0.40–1.50)
GFR: 126.79 mL/min (ref >60.00)
Glucose, Bld: 309 mg/dL — ABNORMAL HIGH (ref 70–99)
Potassium: 4.4 mEq/L (ref 3.5–5.1)
Sodium: 135 mEq/L (ref 135–145)
Total Bilirubin: 1.6 mg/dL — ABNORMAL HIGH (ref 0.2–1.2)
Total Protein: 7.7 g/dL (ref 6.0–8.3)

## 2021-07-17 LAB — LIPID PANEL
Cholesterol: 249 mg/dL — ABNORMAL HIGH (ref 0–200)
HDL: 57.4 mg/dL (ref >39.00)
LDL Cholesterol: 159 mg/dL — ABNORMAL HIGH (ref 0–99)
NonHDL: 191.37
Total CHOL/HDL Ratio: 4
Triglycerides: 160 mg/dL — ABNORMAL HIGH (ref 0.0–149.0)
VLDL: 32 mg/dL (ref 0.0–40.0)

## 2021-07-17 LAB — CBC
HCT: 47.2 % (ref 39.0–52.0)
Hemoglobin: 16.4 g/dL (ref 13.0–17.0)
MCHC: 34.7 g/dL (ref 30.0–36.0)
MCV: 87.3 fl (ref 78.0–100.0)
Platelets: 229 10*3/uL (ref 150.0–400.0)
RBC: 5.4 Mil/uL (ref 4.22–5.81)
RDW: 13.1 % (ref 11.5–15.5)
WBC: 6.6 10*3/uL (ref 4.0–10.5)

## 2021-07-17 LAB — POCT GLYCOSYLATED HEMOGLOBIN (HGB A1C): Hemoglobin A1C: 12 % — AB (ref 4.0–5.6)

## 2021-07-17 MED ORDER — DAPAGLIFLOZIN PROPANEDIOL 5 MG PO TABS
5.0000 mg | ORAL_TABLET | Freq: Every day | ORAL | 1 refills | Status: DC
Start: 2021-07-17 — End: 2022-02-26

## 2021-07-17 MED ORDER — METFORMIN HCL 1000 MG PO TABS: 1000.0000 mg | ORAL_TABLET | Freq: Two times a day (BID) | ORAL | 3 refills | Status: DC

## 2021-07-17 MED ORDER — GLIPIZIDE 10 MG PO TABS: 10.0000 mg | ORAL_TABLET | Freq: Two times a day (BID) | ORAL | 3 refills | Status: DC

## 2021-07-17 MED ORDER — LISINOPRIL 5 MG PO TABS: 5.0000 mg | ORAL_TABLET | Freq: Every day | ORAL | 3 refills | Status: DC

## 2021-07-17 MED ORDER — METFORMIN HCL 500 MG PO TABS
ORAL_TABLET | ORAL | 0 refills | Status: DC
Start: 2021-07-17 — End: 2022-02-08

## 2021-07-17 NOTE — Assessment & Plan Note (Signed)
Not currently on medications.  Last LDL was under 110.  Given guidelines patient probably would benefit from statin medication.  We will wait on lab results to see the patient statin for starting statin medication.

## 2021-07-17 NOTE — Assessment & Plan Note (Addendum)
Patient has not been seen in clinic in approximately 1 year.  Patient was maintained on glipizide 10 mg twice daily.  Per patient report he is taking medication as prescribed.  His A1c has dropped from 12.4-12 in office today.  Patient has also lost weight since last office visit.  Does have family history of mother having diabetes did question he has been on metformin and Farxiga in the past but unsure as to why things were changed or what not.  We will continue glipizide 10 mg twice daily.  We will add on metformin twice daily and start [place] daily.  Encouraged to check his glucose twice daily we will do that encouraged him to please check it daily preferred fasting.  Patient states he has supplies at home.  Follow-up in clinic in 3 months

## 2021-07-17 NOTE — Progress Notes (Signed)
Established Patient Office Visit  Subjective:  Patient ID: Sean Gonzalez, male    DOB: 11/11/1990  Age: 30 y.o. MRN: [id number]  CC:  Chief Complaint  Patient presents with   Transfer of Care   Diabetes    Follow up    HPI Sean Gonzalez presents for Transfer of care  DM: States that he checks glucose on occassionally, once weekly. Has not checked it since may. Does take medication as prescribed. Patient states that she plays soccer and walks on occasion. 30-60 mins twice weekly. He has lost appox 12 pounds since last visit.  Hyperlipidemia: not currently on any medication. He does try and follow low carb and keto style meal planning and eating  Past Medical History:  Diagnosis Date   Diabetes mellitus, new onset (HCC) 11/27/2018   Elevated liver function tests 11/27/2018   Hepatic steatosis 03/19/2019   Mild per [place]    Past Surgical History:  Procedure Laterality Date   NO PAST SURGERIES      Family History  Problem Relation Age of Onset   Diabetes Mother    Stomach cancer Neg Hx    Colon cancer Neg Hx    Pancreatic cancer Neg Hx     Social History   Socioeconomic History   Marital status: Single    Spouse name: Not on file   Number of children: 0   Years of education: Not on file   Highest education level: Not on file  Occupational History   Occupation: Customer Service  Tobacco Use   Smoking status: Never   Smokeless tobacco: Never  Vaping Use   Vaping Use: Never used  Substance and Sexual Activity   Alcohol use: Not Currently   Drug use: Never   Sexual activity: Yes    Partners: Female  Other Topics Concern   Not on file  Social History Narrative   Not on file   Social Determinants of Health   Financial Resource Strain: Not on file  Food Insecurity: Not on file  Transportation Needs: Not on file  Physical Activity: Not on file  Stress: Not on file  Social Connections: Not on file  Intimate Partner Violence: Not on file     Outpatient Medications Prior to Visit  Medication Sig Dispense Refill   glipiZIDE (GLUCOTROL) 10 MG tablet TAKE 1 TABLET (10 MG TOTAL) BY MOUTH 2 (TWO) TIMES DAILY BEFORE A MEAL. 180 tablet 0   lisinopril (ZESTRIL) 5 MG tablet TAKE 1 TABLET BY MOUTH EVERY DAY 90 tablet 0   No facility-administered medications prior to visit.    No Known Allergies  ROS Review of Systems  Constitutional:  Negative for chills, fatigue and fever.  Respiratory:  Negative for cough and shortness of breath.   Cardiovascular:  Negative for chest pain, palpitations and leg swelling.  Gastrointestinal:  Negative for diarrhea, nausea and vomiting.  Endocrine: Negative for polydipsia, polyphagia and polyuria.  Neurological:  Negative for numbness.  Psychiatric/Behavioral:  Negative for hallucinations and suicidal ideas.      Objective:    Physical Exam Vitals and nursing note reviewed.  Constitutional:      Appearance: Normal appearance.  Neck:     Vascular: No carotid bruit.  Cardiovascular:     Rate and Rhythm: Normal rate and regular rhythm.     Pulses:          Dorsalis pedis pulses are 1+ on the right side and 1+ on the left side.  Heart sounds: No murmur heard. Pulmonary:     Effort: Pulmonary effort is normal.     Breath sounds: Normal breath sounds.  Abdominal:     General: Bowel sounds are normal.  Musculoskeletal:     Right lower leg: No edema.     Left lower leg: No edema.  Lymphadenopathy:     Cervical: No cervical adenopathy.  Skin:    General: Skin is warm.  Neurological:     Mental Status: He is alert.     Motor: No weakness.     Coordination: Coordination normal.     Gait: Gait normal.     Deep Tendon Reflexes: Reflexes normal.  Psychiatric:        Mood and Affect: Mood normal.        Behavior: Behavior normal.        Thought Content: Thought content normal.        Judgment: Judgment normal.    BP (!) 134/96   Pulse (!) 102   Temp 98 F (36.7 C)   Resp 10    Ht 5\' 7"  (1.702 m)   Wt 170 lb 6 oz (77.3 kg)   SpO2 97%   BMI 26.68 kg/m  Wt Readings from Last 3 Encounters:  07/17/21 170 lb 6 oz (77.3 kg)  08/26/20 182 lb (82.6 kg)  06/16/20 181 lb (82.1 kg)     Health Maintenance Due  Topic Date Due   OPHTHALMOLOGY EXAM  Never done   COVID-19 Vaccine (2 - Booster for Janssen series) 04/27/2020   HEMOGLOBIN A1C  02/24/2021    There are no preventive care reminders to display for this patient.  Lab Results  Component Value Date   TSH 0.776 07/02/2019   Lab Results  Component Value Date   WBC 6.4 08/26/2020   HGB 15.5 08/26/2020   HCT 44.7 08/26/2020   MCV 86.4 08/26/2020   PLT 199.0 08/26/2020   Lab Results  Component Value Date   NA 135 08/26/2020   K 3.9 08/26/2020   CO2 30 08/26/2020   GLUCOSE 290 (H) 08/26/2020   BUN 11 08/26/2020   CREATININE 0.62 08/26/2020   BILITOT 1.1 08/26/2020   ALKPHOS 172 (H) 08/26/2020   AST 66 (H) 08/26/2020   ALT 204 (H) 08/26/2020   PROT 6.6 08/26/2020   ALBUMIN 4.2 08/26/2020   CALCIUM 9.4 08/26/2020   GFR 129.42 08/26/2020   Lab Results  Component Value Date   CHOL 219 (H) 08/26/2020   Lab Results  Component Value Date   HDL 52.00 08/26/2020   Lab Results  Component Value Date   LDLCALC 106 (H) 03/19/2019   Lab Results  Component Value Date   TRIG 201.0 (H) 08/26/2020   Lab Results  Component Value Date   CHOLHDL 4 08/26/2020   Lab Results  Component Value Date   HGBA1C 12.3 (H) 08/26/2020      Assessment & Plan:   Problem List Items Addressed This Visit       Endocrine   Hyperlipidemia associated with type 2 diabetes mellitus (HCC)    Not currently on medications.  Last LDL was under 110.  Given guidelines patient probably would benefit from statin medication.  We will wait on lab results to see the patient statin for starting statin medication.      Relevant Medications   metFORMIN (GLUCOPHAGE) 500 MG tablet   dapagliflozin propanediol (FARXIGA) 5 MG  TABS tablet   metFORMIN (GLUCOPHAGE) 1000 MG tablet   glipiZIDE (  GLUCOTROL) 10 MG tablet   lisinopril (ZESTRIL) 5 MG tablet   Uncontrolled type 2 diabetes mellitus with hyperglycemia (HCC) - Primary    Patient has not been seen in clinic in approximately 1 year.  Patient was maintained on glipizide 10 mg twice daily.  Per patient report he is taking medication as prescribed.  His A1c has dropped from 12.4-12 in office today.  Patient has also lost weight since last office visit.  Does have family history of mother having diabetes did question he has been on metformin and Farxiga in the past but unsure as to why things were changed or what not.  We will continue glipizide 10 mg twice daily.  We will add on metformin twice daily and start [place] daily.  Encouraged to check his glucose twice daily we will do that encouraged him to please check it daily preferred fasting.  Patient states he has supplies at home.  Follow-up in clinic in 3 months      Relevant Medications   metFORMIN (GLUCOPHAGE) 500 MG tablet   dapagliflozin propanediol (FARXIGA) 5 MG TABS tablet   metFORMIN (GLUCOPHAGE) 1000 MG tablet   glipiZIDE (GLUCOTROL) 10 MG tablet   lisinopril (ZESTRIL) 5 MG tablet   Other Relevant Orders   POCT glycosylated hemoglobin (Hb A1C) (Completed)   CBC   Comprehensive metabolic panel   Lipid panel   Insulin, Free (Bioactive)   C-peptide   Microalbumin / creatinine urine ratio    No orders of the defined types were placed in this encounter.   Follow-up: Return in about 14 weeks (around 10/23/2021) for DM recheck.   This visit occurred during the SARS-CoV-2 public health emergency.  Safety protocols were in place, including screening questions prior to the visit, additional usage of staff PPE, and extensive cleaning of exam room while observing appropriate contact time as indicated for disinfecting solutions.   Audria Nine, NP

## 2021-07-17 NOTE — Patient Instructions (Signed)
I want you to check your sugar twice daily. If not at least once a day, first thing in the morning before eating Will be in touch about your labs Follow up in approx 3 months

## 2021-07-24 LAB — C-PEPTIDE: C-Peptide: 1.97 ng/mL (ref 0.80–3.85)

## 2021-07-24 LAB — INSULIN, FREE (BIOACTIVE): Insulin, Free: 5.5 u[IU]/mL (ref 1.5–14.9)

## 2021-08-04 ENCOUNTER — Other Ambulatory Visit: Payer: Self-pay | Admitting: Nurse Practitioner

## 2021-08-04 DIAGNOSIS — E785 Hyperlipidemia, unspecified: Secondary | ICD-10-CM

## 2021-08-04 DIAGNOSIS — E1169 Type 2 diabetes mellitus with other specified complication: Secondary | ICD-10-CM

## 2021-08-04 MED ORDER — ROSUVASTATIN CALCIUM 5 MG PO TABS: 5.0000 mg | ORAL_TABLET | Freq: Every day | ORAL | 0 refills | Status: DC

## 2021-08-04 NOTE — Progress Notes (Signed)
error 

## 2021-09-04 ENCOUNTER — Encounter: Payer: BC Managed Care – PPO | Admitting: Internal Medicine

## 2021-10-22 ENCOUNTER — Ambulatory Visit: Payer: BC Managed Care – PPO | Admitting: Nurse Practitioner

## 2021-11-05 ENCOUNTER — Ambulatory Visit: Payer: BC Managed Care – PPO | Admitting: Nurse Practitioner

## 2022-01-14 ENCOUNTER — Other Ambulatory Visit: Payer: Self-pay | Admitting: Nurse Practitioner

## 2022-02-08 ENCOUNTER — Other Ambulatory Visit: Payer: Self-pay | Admitting: Nurse Practitioner

## 2022-02-08 DIAGNOSIS — E1165 Type 2 diabetes mellitus with hyperglycemia: Secondary | ICD-10-CM

## 2022-02-08 MED ORDER — METFORMIN HCL 500 MG PO TABS: ORAL_TABLET | ORAL | 0 refills | Status: DC

## 2022-02-08 NOTE — Telephone Encounter (Signed)
Please advise 

## 2022-02-08 NOTE — Telephone Encounter (Signed)
It looks like this was a ramp up px ? ?Please refill for the amt he is taking now/ one month for NP Cable to return  ? ? ?

## 2022-02-18 ENCOUNTER — Ambulatory Visit: Payer: BC Managed Care – PPO | Admitting: Nurse Practitioner

## 2022-02-25 ENCOUNTER — Ambulatory Visit: Payer: BC Managed Care – PPO | Admitting: Nurse Practitioner

## 2022-02-26 ENCOUNTER — Encounter: Payer: Self-pay | Admitting: Nurse Practitioner

## 2022-02-26 ENCOUNTER — Ambulatory Visit (INDEPENDENT_AMBULATORY_CARE_PROVIDER_SITE_OTHER): Payer: BC Managed Care – PPO | Admitting: Nurse Practitioner

## 2022-02-26 ENCOUNTER — Ambulatory Visit: Payer: BC Managed Care – PPO | Admitting: Nurse Practitioner

## 2022-02-26 VITALS — BP 142/100 | HR 98 | Temp 97.3°F | Resp 12 | Ht 69.0 in | Wt 178.5 lb

## 2022-02-26 DIAGNOSIS — E1165 Type 2 diabetes mellitus with hyperglycemia: Secondary | ICD-10-CM

## 2022-02-26 DIAGNOSIS — E1169 Type 2 diabetes mellitus with other specified complication: Secondary | ICD-10-CM

## 2022-02-26 DIAGNOSIS — E785 Hyperlipidemia, unspecified: Secondary | ICD-10-CM | POA: Diagnosis not present

## 2022-02-26 DIAGNOSIS — R030 Elevated blood-pressure reading, without diagnosis of hypertension: Secondary | ICD-10-CM

## 2022-02-26 DIAGNOSIS — J069 Acute upper respiratory infection, unspecified: Secondary | ICD-10-CM

## 2022-02-26 DIAGNOSIS — H7293 Unspecified perforation of tympanic membrane, bilateral: Secondary | ICD-10-CM

## 2022-02-26 LAB — MICROALBUMIN / CREATININE URINE RATIO
Creatinine,U: 103.5 mg/dL
Microalb Creat Ratio: 3.9 mg/g (ref 0.0–30.0)
Microalb, Ur: 4.1 mg/dL — ABNORMAL HIGH (ref 0.0–1.9)

## 2022-02-26 LAB — CBC
HCT: 46.1 % (ref 39.0–52.0)
Hemoglobin: 16.1 g/dL (ref 13.0–17.0)
MCHC: 34.9 g/dL (ref 30.0–36.0)
MCV: 87.1 fl (ref 78.0–100.0)
Platelets: 244 10*3/uL (ref 150.0–400.0)
RBC: 5.3 Mil/uL (ref 4.22–5.81)
RDW: 13.3 % (ref 11.5–15.5)
WBC: 7.2 10*3/uL (ref 4.0–10.5)

## 2022-02-26 LAB — POCT GLYCOSYLATED HEMOGLOBIN (HGB A1C): Hemoglobin A1C: 12.1 % — AB (ref 4.0–5.6)

## 2022-02-26 LAB — COMPREHENSIVE METABOLIC PANEL
ALT: 58 U/L — ABNORMAL HIGH (ref 0–53)
AST: 23 U/L (ref 0–37)
Albumin: 4.4 g/dL (ref 3.5–5.2)
Alkaline Phosphatase: 164 U/L — ABNORMAL HIGH (ref 39–117)
BUN: 11 mg/dL (ref 6–23)
CO2: 32 mEq/L (ref 19–32)
Calcium: 9.5 mg/dL (ref 8.4–10.5)
Chloride: 96 mEq/L (ref 96–112)
Creatinine, Ser: 0.62 mg/dL (ref 0.40–1.50)
GFR: 128.06 mL/min (ref >60.00)
Glucose, Bld: 301 mg/dL — ABNORMAL HIGH (ref 70–99)
Potassium: 4.2 mEq/L (ref 3.5–5.1)
Sodium: 134 mEq/L — ABNORMAL LOW (ref 135–145)
Total Bilirubin: 1.3 mg/dL — ABNORMAL HIGH (ref 0.2–1.2)
Total Protein: 7.4 g/dL (ref 6.0–8.3)

## 2022-02-26 LAB — LIPASE: Lipase: 11 U/L (ref 11.0–59.0)

## 2022-02-26 MED ORDER — OZEMPIC (0.25 OR 0.5 MG/DOSE) 2 MG/1.5ML ~~LOC~~ SOPN: PEN_INJECTOR | SUBCUTANEOUS | 0 refills | Status: AC

## 2022-02-26 MED ORDER — METFORMIN HCL 1000 MG PO TABS: 1000.0000 mg | ORAL_TABLET | Freq: Two times a day (BID) | ORAL | 1 refills | Status: DC

## 2022-02-26 MED ORDER — AMOXICILLIN-POT CLAVULANATE 875-125 MG PO TABS: 1.0000 | ORAL_TABLET | Freq: Two times a day (BID) | ORAL | 0 refills | Status: AC

## 2022-02-26 NOTE — Progress Notes (Signed)
? ?Established Patient Office Visit ? ?Subjective   ?Patient ID: Sean Gonzalez, male    DOB: 1991/10/27  Age: 31 y.o. MRN: [id number] ? ?Chief Complaint  ?Patient presents with  ? Diabetes  ?  Follow up  ? Cough  ?  X 3 weeks, post nasal drip, runny nose, headache, slight sinus pressure, felt feverish yesterday. Has taking OTC vix cold and flu and tylenol. No covid test taking  ? ? ? ? ?DM: Not checking his sugars at home, has the supplies. ?States he has been taking the glipizide and metformin twice a day.  He was placed on Farxiga but has ran out and been out for a period of time. ? ?Cough: symptoms have been present for 2.5 weeks. Started with a slight cough and increased this past Sunday. Nasal stuff started on Monday. Has gotten worse ?No sick contactsJ ?J&J vaccine ?Flu not up to date ?He has been using vick cold and flu and tylenol with some relief  ? ? ? ?Review of Systems  ?Constitutional:  Positive for fever (subjective). Negative for chills and malaise/fatigue.  ?HENT:  Positive for congestion, ear pain (full) and sore throat. Negative for sinus pain.   ?Respiratory:  Positive for cough (productive at times. White/yellow color) and shortness of breath.   ?Cardiovascular:  Negative for chest pain.  ?Gastrointestinal:  Positive for diarrhea. Negative for nausea and vomiting.  ?Genitourinary:  Negative for dysuria, frequency and hematuria.  ?Neurological:  Positive for headaches.  ? ?  ?Objective:  ?  ? ?BP (!) 142/100   Pulse 98   Temp (!) 97.3 ?F (36.3 ?C)   Resp 12   Ht 5\' 9"  (1.753 m)   Wt 178 lb 8 oz (81 kg)   SpO2 99%   BMI 26.36 kg/m?  ? ? ?Physical Exam ?Constitutional:   ?   Appearance: Normal appearance.  ?HENT:  ?   Right Ear: Ear canal and external ear normal.  ?   Left Ear: Ear canal and external ear normal.  ?   Nose:  ?   Right Sinus: No maxillary sinus tenderness or frontal sinus tenderness.  ?   Left Sinus: No maxillary sinus tenderness or frontal sinus tenderness.  ?   Mouth/Throat:   ?   Mouth: Mucous membranes are moist.  ?   Pharynx: Oropharynx is clear.  ?Cardiovascular:  ?   Rate and Rhythm: Normal rate and regular rhythm.  ?   Heart sounds: Normal heart sounds.  ?Pulmonary:  ?   Effort: Pulmonary effort is normal.  ?   Breath sounds: Normal breath sounds.  ?Abdominal:  ?   General: Bowel sounds are normal.  ?Musculoskeletal:  ?   Right lower leg: No edema.  ?   Left lower leg: No edema.  ?     Feet: ? ?Neurological:  ?   Mental Status: He is alert.  ? ? ? ?Results for orders placed or performed in visit on 02/26/22  ?Microalbumin/Creatinine Ratio, Urine  ?Result Value Ref Range  ? Microalb, Ur 4.1 (H) 0.0 - 1.9 mg/dL  ? Creatinine,U 103.5 mg/dL  ? Microalb Creat Ratio 3.9 0.0 - 30.0 mg/g  ?CBC  ?Result Value Ref Range  ? WBC 7.2 4.0 - 10.5 K/uL  ? RBC 5.30 4.22 - 5.81 Mil/uL  ? Platelets 244.0 150.0 - 400.0 K/uL  ? Hemoglobin 16.1 13.0 - 17.0 g/dL  ? HCT 46.1 39.0 - 52.0 %  ? MCV 87.1 78.0 - 100.0 fl  ?  MCHC 34.9 30.0 - 36.0 g/dL  ? RDW 13.3 11.5 - 15.5 %  ?Comprehensive metabolic panel  ?Result Value Ref Range  ? Sodium 134 (L) 135 - 145 mEq/L  ? Potassium 4.2 3.5 - 5.1 mEq/L  ? Chloride 96 96 - 112 mEq/L  ? CO2 32 19 - 32 mEq/L  ? Glucose, Bld 301 (H) 70 - 99 mg/dL  ? BUN 11 6 - 23 mg/dL  ? Creatinine, Ser 0.62 0.40 - 1.50 mg/dL  ? Total Bilirubin 1.3 (H) 0.2 - 1.2 mg/dL  ? Alkaline Phosphatase 164 (H) 39 - 117 U/L  ? AST 23 0 - 37 U/L  ? ALT 58 (H) 0 - 53 U/L  ? Total Protein 7.4 6.0 - 8.3 g/dL  ? Albumin 4.4 3.5 - 5.2 g/dL  ? GFR 128.06 >60.00 mL/min  ? Calcium 9.5 8.4 - 10.5 mg/dL  ?Lipase  ?Result Value Ref Range  ? Lipase 11.0 11.0 - 59.0 U/L  ?POCT glycosylated hemoglobin (Hb A1C)  ?Result Value Ref Range  ? Hemoglobin A1C 12.1 (A) 4.0 - 5.6 %  ? HbA1c POC (<> result, manual entry)    ? HbA1c, POC (prediabetic range)    ? HbA1c, POC (controlled diabetic range)    ? ? ? ? ?The ASCVD Risk score (Arnett DK, et al., 2019) failed to calculate for the following reasons: ?  The 2019 ASCVD  risk score is only valid for ages 3040 to 3979 ? ?  ?Assessment & Plan:  ? ?Problem List Items Addressed This Visit   ? ?  ? Respiratory  ? Upper respiratory tract infection  ?  Given patient's length of illness, symptoms, and worsening progression and patient being uncontrolled diabetic we will like to treat with Augmentin twice daily for 7 days.  Follow-up if no improvement ? ?  ?  ? Relevant Medications  ? amoxicillin-clavulanate (AUGMENTIN) 875-125 MG tablet  ?  ? Endocrine  ? Hyperlipidemia associated with type 2 diabetes mellitus (HCC)  ? Relevant Medications  ? metFORMIN (GLUCOPHAGE) 1000 MG tablet  ? Semaglutide,0.25 or 0.5MG /DOS, (OZEMPIC, 0.25 OR 0.5 MG/DOSE,) 2 MG/1.5ML SOPN  ? Other Relevant Orders  ? Lipase (Completed)  ? Uncontrolled type 2 diabetes mellitus with hyperglycemia (HCC) - Primary  ?  We will continue glipizide 10 mg twice daily, metformin 1000 mg twice daily and our Ozempic 0.25 mg.  Patient is not checking glucose at home but does have the supplies.  Did encourage patient to check blood sugar twice daily at home.  Did have a detailed conversation with patient about keeping follow-ups and the importance of coming that we will get his diabetes under control if not he will shave many many years off of his life.  He acknowledged and states he understood. ? ?  ?  ? Relevant Medications  ? metFORMIN (GLUCOPHAGE) 1000 MG tablet  ? Semaglutide,0.25 or 0.5MG /DOS, (OZEMPIC, 0.25 OR 0.5 MG/DOSE,) 2 MG/1.5ML SOPN  ? Other Relevant Orders  ? Microalbumin/Creatinine Ratio, Urine (Completed)  ? POCT glycosylated hemoglobin (Hb A1C) (Completed)  ? CBC (Completed)  ? Comprehensive metabolic panel (Completed)  ?  ? Nervous and Auditory  ? Ruptured tympanic membrane, bilateral  ?  On exam looks as if bilateral ruptured TMs.  Patient states he thinks this due to loud noises.  Been many months.  Asymptomatic in office.  Will refer to ENT for further evaluation to make sure it closes. ? ?  ?  ? Relevant Orders  ?  Ambulatory  referral to ENT  ?  ? Other  ? Elevated blood pressure reading  ?  Blood pressure elevated in office.  Patient was on ACE inhibitor for renal protection may have help balance her blood pressure.  Patient's been on medication for extended period of time.  Start back on lisinopril.  Follow-up as requested ? ?  ?  ? ? ?Return in about 14 weeks (around 06/04/2022) for DM recheck.  ? ? ?Audria Nine, NP ? ?

## 2022-02-26 NOTE — Patient Instructions (Addendum)
Nice to see you today ?I sent in the metformin 1000mg  to take twice a day ?I want to see you in 14 weeks (3.5 months) ?You will inject ozempic once a week (same day of the week) we will do 0.25 mg once a week for a month then titrate up to 0.5 mg once a week after that until you see me again. ? ?We will stop the farxiga and replace it with the ozempic ?

## 2022-02-27 DIAGNOSIS — J069 Acute upper respiratory infection, unspecified: Secondary | ICD-10-CM | POA: Insufficient documentation

## 2022-02-27 DIAGNOSIS — H7293 Unspecified perforation of tympanic membrane, bilateral: Secondary | ICD-10-CM | POA: Insufficient documentation

## 2022-02-27 DIAGNOSIS — R030 Elevated blood-pressure reading, without diagnosis of hypertension: Secondary | ICD-10-CM | POA: Insufficient documentation

## 2022-02-27 NOTE — Assessment & Plan Note (Signed)
We will continue glipizide 10 mg twice daily, metformin 1000 mg twice daily and our Ozempic 0.25 mg.  Patient is not checking glucose at home but does have the supplies.  Did encourage patient to check blood sugar twice daily at home.  Did have a detailed conversation with patient about keeping follow-ups and the importance of coming that we will get his diabetes under control if not he will shave many many years off of his life.  He acknowledged and states he understood. ?

## 2022-02-27 NOTE — Assessment & Plan Note (Signed)
Given patient's length of illness, symptoms, and worsening progression and patient being uncontrolled diabetic we will like to treat with Augmentin twice daily for 7 days.  Follow-up if no improvement ?

## 2022-02-27 NOTE — Assessment & Plan Note (Signed)
Blood pressure elevated in office.  Patient was on ACE inhibitor for renal protection may have help balance her blood pressure.  Patient's been on medication for extended period of time.  Start back on lisinopril.  Follow-up as requested ?

## 2022-02-27 NOTE — Assessment & Plan Note (Signed)
On exam looks as if bilateral ruptured TMs.  Patient states he thinks this due to loud noises.  Been many months.  Asymptomatic in office.  Will refer to ENT for further evaluation to make sure it closes. ?

## 2022-03-06 ENCOUNTER — Other Ambulatory Visit: Payer: Self-pay | Admitting: Family Medicine

## 2022-03-06 DIAGNOSIS — E1165 Type 2 diabetes mellitus with hyperglycemia: Secondary | ICD-10-CM

## 2022-03-08 ENCOUNTER — Encounter: Payer: Self-pay | Admitting: *Deleted

## 2022-04-29 ENCOUNTER — Encounter: Payer: Self-pay | Admitting: Nurse Practitioner

## 2022-04-29 ENCOUNTER — Ambulatory Visit (INDEPENDENT_AMBULATORY_CARE_PROVIDER_SITE_OTHER): Payer: BC Managed Care – PPO | Admitting: Nurse Practitioner

## 2022-04-29 VITALS — BP 126/82 | HR 127 | Temp 97.6°F | Resp 12 | Ht 69.0 in | Wt 171.5 lb

## 2022-04-29 DIAGNOSIS — B353 Tinea pedis: Secondary | ICD-10-CM | POA: Diagnosis not present

## 2022-04-29 MED ORDER — TERBINAFINE HCL 250 MG PO TABS: 250.0000 mg | ORAL_TABLET | Freq: Every day | ORAL | 0 refills | Status: DC

## 2022-04-29 NOTE — Patient Instructions (Signed)
Nice to see you today Discontinue the soaks on the feet.  I need to see you in 2 weeks to make sure the liver did ok with the medications

## 2022-04-29 NOTE — Progress Notes (Signed)
Established Patient Office Visit  Subjective   Patient ID: Sean Gonzalez, male    DOB: 1991-09-02  Age: 31 y.o. MRN: [id number]  Chief Complaint  Patient presents with   skin issues    X 2 weeks. Discoloration and itchy and dry skin of the right foot. Sometimes gets puss come out from the bottom of his right foot. Has been using OTC moisturizing lotion.    HPI  Skin issues: States that it starteed approx 2 weeks ago. States that he noticed it was slightly itchy. States that it did get worse and then some better. Has been using OTC athletes foot cream and soaking with vingegar and listerine. He is also using lotion and vasoline on the feet.  States there has been discharge coming out of that but has been clear in nature.  States that he has been going to the gym more. Staes that he will change after work, after gym and prior to bed. States that he feel like he has been having more moist feet.  He has been checking sugar approx 2 times a day. States that his sugars have been 160 -190s  Highest was 262 No Hypoglycemia     Review of Systems  Constitutional:  Negative for chills and fever.  Gastrointestinal:  Negative for abdominal pain and diarrhea.  Genitourinary:  Negative for dysuria and frequency.  Neurological:  Negative for tingling.      Objective:     BP 126/82   Pulse (!) 127   Temp 97.6 F (36.4 C)   Resp 12   Ht 5\' 9"  (1.753 m)   Wt 171 lb 8 oz (77.8 kg)   SpO2 98%   BMI 25.33 kg/m    Physical Exam Constitutional:      Appearance: Normal appearance.  Cardiovascular:     Rate and Rhythm: Regular rhythm. Tachycardia present.     Pulses: Normal pulses.     Heart sounds: Normal heart sounds.     Comments: Auscultated rate sounded lower than 127 approximately 110 beats Pulmonary:     Breath sounds: Normal breath sounds.  Musculoskeletal:       Feet:  Feet:     Comments: Patient did have 2 cracks in skin of feet see chart drawing Skin:    General:  Skin is warm.     Findings: Rash present. No erythema.  Neurological:     Mental Status: He is alert.         No results found for any visits on 04/29/22.    The ASCVD Risk score (Arnett DK, et al., 2019) failed to calculate for the following reasons:   The 2019 ASCVD risk score is only valid for ages 47 to 62    Assessment & Plan:   Problem List Items Addressed This Visit       Musculoskeletal and Integument   Tinea pedis of right foot - Primary    Uncontrolled diabetes and look of rash will like to treat with oral terbinafine 250 mg daily for 2 weeks.  Patient's liver enzymes have been elevated in the past but past visit closest normal they have been.  Did discuss liver issues possible using antifungal with patient.  He will have close follow-up in 2 weeks to see how therapy is doing and recheck liver functions.  Follow-up sooner if needed      Relevant Medications   terbinafine (LAMISIL) 250 MG tablet    Return in about 2 weeks (around 05/13/2022) for  rash and liver recheck .    Audria Nine, NP

## 2022-04-29 NOTE — Assessment & Plan Note (Signed)
Uncontrolled diabetes and look of rash will like to treat with oral terbinafine 250 mg daily for 2 weeks.  Patient's liver enzymes have been elevated in the past but past visit closest normal they have been.  Did discuss liver issues possible using antifungal with patient.  He will have close follow-up in 2 weeks to see how therapy is doing and recheck liver functions.  Follow-up sooner if needed

## 2022-05-13 DIAGNOSIS — H7403 Tympanosclerosis, bilateral: Secondary | ICD-10-CM | POA: Diagnosis not present

## 2022-05-13 DIAGNOSIS — H9313 Tinnitus, bilateral: Secondary | ICD-10-CM | POA: Diagnosis not present

## 2022-05-14 ENCOUNTER — Ambulatory Visit (INDEPENDENT_AMBULATORY_CARE_PROVIDER_SITE_OTHER): Payer: BC Managed Care – PPO | Admitting: Nurse Practitioner

## 2022-05-14 VITALS — BP 144/98 | HR 106 | Temp 97.6°F | Resp 10 | Ht 69.0 in | Wt 170.2 lb

## 2022-05-14 DIAGNOSIS — R7989 Other specified abnormal findings of blood chemistry: Secondary | ICD-10-CM | POA: Diagnosis not present

## 2022-05-14 DIAGNOSIS — B353 Tinea pedis: Secondary | ICD-10-CM | POA: Diagnosis not present

## 2022-05-14 DIAGNOSIS — K130 Diseases of lips: Secondary | ICD-10-CM

## 2022-05-14 MED ORDER — NYSTATIN-TRIAMCINOLONE 100000-0.1 UNIT/GM-% EX OINT: 1.0000 | TOPICAL_OINTMENT | Freq: Two times a day (BID) | CUTANEOUS | 0 refills | Status: DC

## 2022-05-14 NOTE — Assessment & Plan Note (Signed)
Patient seems to have clinical presentation of cheilitis.  We will obtain B12 today and start him on Mycolog cream that he can use twice a day for no more than 7 days.  Did review steroid precautions in regards to skin atrophy and pigmentation change of skin.  Follow-up if no improvement

## 2022-05-14 NOTE — Patient Instructions (Signed)
Nice to see you today Glad the foot is improving. If it gets worse or changes let me know I sent in an ointment that can be used on the lips no longer than 7 days. Follow up with me in 1 month for your normal follow up

## 2022-05-14 NOTE — Progress Notes (Signed)
Acute Office Visit  Subjective:     Patient ID: Sean Gonzalez, male    DOB: [birth year], 31 y.o.   MRN: [id number]  Chief Complaint  Patient presents with   Rash    Follow up- rash on feet-thinks it is better, not worse. He has noticed a rash and dryness his chin and lips area that started about a week after seen [place].     Patient is in today for Rash  Patient was evaluated in office on 04/29/2022 clinical photos were obtained and are in the chart.  Thought to be tinea pedis patient was placed on terbinafine 250 mg daily for 2 weeks.  Patient is here for follow-up today  Patient states he feels like there is some improvement with the rash today.  He states the open splits that he was experiencing now closed and he does not aperients as much itching as he had been.  States the itching is very infrequent and most the time it does not bother him.  States that he has been using chapstick. States that it has been approx 8-9 days ago. States that the corners of his mouth will crack and bleed.  He was concern for cross-contamination of the fungal infection of his foot to his mouth.  States he does frequent the gym and tries to be very mindful of wiping off machines before he touches them.  Review of Systems  Constitutional:  Negative for chills and fever.  Gastrointestinal:  Negative for abdominal pain, nausea and vomiting.  Skin:  Positive for itching and rash.        Objective:    BP (!) 144/98   Pulse (!) 106   Temp 97.6 F (36.4 C)   Resp 10   Ht 5\' 9"  (1.753 m)   Wt 170 lb 4 oz (77.2 kg)   SpO2 97%   BMI 25.14 kg/m    Physical Exam Vitals and nursing note reviewed.  Cardiovascular:     Rate and Rhythm: Regular rhythm. Tachycardia present.     Heart sounds: Normal heart sounds.  Pulmonary:     Effort: Pulmonary effort is normal.     Breath sounds: Normal breath sounds.  Skin:    Findings: Lesion and rash present.          No results found for any visits on  05/14/22.      Assessment & Plan:   Problem List Items Addressed This Visit       Digestive   Cheilitis - Primary    Patient seems to have clinical presentation of cheilitis.  We will obtain B12 today and start him on Mycolog cream that he can use twice a day for no more than 7 days.  Did review steroid precautions in regards to skin atrophy and pigmentation change of skin.  Follow-up if no improvement      Relevant Medications   nystatin-triamcinolone ointment (MYCOLOG)   Other Relevant Orders   Vitamin B12     Musculoskeletal and Integument   Tinea pedis of right foot    Per patient report seems better do see some clinical improvement between first visit today.  Did encourage patient to use moisturizing lotion on foot to help with the dry skin.  The openings and cracks have closed on his foot thus far.  He does have history of slow healing and discoloration taking an extended period time to go back to his normal.  Pulses strong and palpable in office  Relevant Medications   nystatin-triamcinolone ointment (MYCOLOG)   Other Relevant Orders   Comprehensive metabolic panel    Meds ordered this encounter  Medications   nystatin-triamcinolone ointment (MYCOLOG)    Sig: Apply 1 Application topically 2 (two) times daily.    Dispense:  30 g    Refill:  0    Order Specific Question:   Supervising Provider    Answer:   Roxy Manns A [[id number]]    Return in about 4 weeks (around 06/11/2022) for DM follow ip .  Audria Nine, NP

## 2022-05-14 NOTE — Assessment & Plan Note (Signed)
Per patient report seems better do see some clinical improvement between first visit today.  Did encourage patient to use moisturizing lotion on foot to help with the dry skin.  The openings and cracks have closed on his foot thus far.  He does have history of slow healing and discoloration taking an extended period time to go back to his normal.  Pulses strong and palpable in office

## 2022-05-15 LAB — COMPREHENSIVE METABOLIC PANEL
AG Ratio: 1.5 (calc) (ref 1.0–2.5)
ALT: 51 U/L — ABNORMAL HIGH (ref 9–46)
AST: 25 U/L (ref 10–40)
Albumin: 4.4 g/dL (ref 3.6–5.1)
Alkaline phosphatase (APISO): 162 U/L — ABNORMAL HIGH (ref 36–130)
BUN: 16 mg/dL (ref 7–25)
CO2: 26 mmol/L (ref 20–32)
Calcium: 9.7 mg/dL (ref 8.6–10.3)
Chloride: 99 mmol/L (ref 98–110)
Creat: 0.77 mg/dL (ref 0.60–1.26)
Globulin: 3 g/dL (calc) (ref 1.9–3.7)
Glucose, Bld: 342 mg/dL — ABNORMAL HIGH (ref 65–99)
Potassium: 4.5 mmol/L (ref 3.5–5.3)
Sodium: 137 mmol/L (ref 135–146)
Total Bilirubin: 0.9 mg/dL (ref 0.2–1.2)
Total Protein: 7.4 g/dL (ref 6.1–8.1)

## 2022-05-15 LAB — VITAMIN B12: Vitamin B-12: 522 pg/mL (ref 200–1100)

## 2022-05-25 NOTE — Telephone Encounter (Signed)
error 

## 2022-06-10 DIAGNOSIS — H5213 Myopia, bilateral: Secondary | ICD-10-CM | POA: Diagnosis not present

## 2022-06-10 DIAGNOSIS — E113293 Type 2 diabetes mellitus with mild nonproliferative diabetic retinopathy without macular edema, bilateral: Secondary | ICD-10-CM | POA: Diagnosis not present

## 2022-06-10 DIAGNOSIS — H52223 Regular astigmatism, bilateral: Secondary | ICD-10-CM | POA: Diagnosis not present

## 2022-06-10 LAB — HM DIABETES EYE EXAM

## 2022-06-11 ENCOUNTER — Encounter: Payer: Self-pay | Admitting: Optometry

## 2022-06-25 ENCOUNTER — Ambulatory Visit: Payer: BC Managed Care – PPO | Admitting: Nurse Practitioner

## 2022-07-01 ENCOUNTER — Ambulatory Visit: Payer: BC Managed Care – PPO | Admitting: Nurse Practitioner

## 2022-07-08 ENCOUNTER — Ambulatory Visit (INDEPENDENT_AMBULATORY_CARE_PROVIDER_SITE_OTHER): Payer: BC Managed Care – PPO | Admitting: Nurse Practitioner

## 2022-07-08 VITALS — BP 138/94 | HR 106 | Temp 97.2°F | Resp 10 | Ht 69.0 in | Wt 170.0 lb

## 2022-07-08 DIAGNOSIS — E1165 Type 2 diabetes mellitus with hyperglycemia: Secondary | ICD-10-CM | POA: Diagnosis not present

## 2022-07-08 DIAGNOSIS — E1159 Type 2 diabetes mellitus with other circulatory complications: Secondary | ICD-10-CM | POA: Diagnosis not present

## 2022-07-08 DIAGNOSIS — I152 Hypertension secondary to endocrine disorders: Secondary | ICD-10-CM | POA: Diagnosis not present

## 2022-07-08 LAB — POCT GLYCOSYLATED HEMOGLOBIN (HGB A1C): Hemoglobin A1C: 11.4 % — AB (ref 4.0–5.6)

## 2022-07-08 MED ORDER — OZEMPIC (0.25 OR 0.5 MG/DOSE) 2 MG/3ML ~~LOC~~ SOPN: 0.2500 mg | PEN_INJECTOR | SUBCUTANEOUS | 0 refills | Status: DC

## 2022-07-08 MED ORDER — LISINOPRIL 10 MG PO TABS: 10.0000 mg | ORAL_TABLET | Freq: Every day | ORAL | 1 refills | Status: DC

## 2022-07-08 MED ORDER — OZEMPIC (0.25 OR 0.5 MG/DOSE) 2 MG/3ML ~~LOC~~ SOPN: 0.5000 mg | PEN_INJECTOR | SUBCUTANEOUS | 0 refills | Status: DC

## 2022-07-08 NOTE — Patient Instructions (Addendum)
Nice to see you today We are increasing your lisinopril from 5mg  to 10mg  daily I sent in the ozempic. If it is expensive or they do not have the medication let me know and we can try something else  [facility] 4.1 (20)  Obstetrician-gynecologist 10.4 mi  [address]  [phone]

## 2022-07-08 NOTE — Progress Notes (Signed)
Established Patient Office Visit  Subjective   Patient ID: Sean Gonzalez, male    DOB: [birth year]  Age: 31 y.o. MRN: [id number]  Chief Complaint  Patient presents with   Diabetes    Follow up-checks sugar off and on, highest been 300, usually around 260s-270s, lowest reading was 230 fasting. Would like to discuss refill on Ozempic, last dose was around 05/17/22.     DM2: States that he will take the medicatoins some week and then some weeks he will not take the medicaoitn. States that he will check his sugar some weeks and then some times he will not. Sometimes he  will take 1 a day of the metformin some time  Exercise: states that he is still exercising but not as much. He did go to  -1 hour every 2 days.   Diet: states that he is doing smaller portions when he is eating  States that he does not have a blood pressure cuff at home. States he has been waking up with headaches approx 1 a week.   Feet have healed. States no itching. States slight discoloration is present    Review of Systems  Constitutional:  Negative for chills and fever.  Respiratory:  Negative for shortness of breath.   Cardiovascular:  Negative for chest pain and leg swelling.  Gastrointestinal:  Positive for diarrhea. Negative for abdominal pain, nausea and vomiting.  Genitourinary:  Negative for dysuria and hematuria.      Objective:     BP (!) 138/94   Pulse (!) 106   Temp (!) 97.2 F (36.2 C)   Resp 10   Ht 5\' 9"  (1.753 m)   Wt 170 lb (77.1 kg)   SpO2 99%   BMI 25.10 kg/m  BP Readings from Last 3 Encounters:  07/08/22 (!) 138/94  05/14/22 (!) 144/98  04/29/22 126/82   Wt Readings from Last 3 Encounters:  07/08/22 170 lb (77.1 kg)  05/14/22 170 lb 4 oz (77.2 kg)  04/29/22 171 lb 8 oz (77.8 kg)      Physical Exam Constitutional:      Appearance: Normal appearance.  Cardiovascular:     Rate and Rhythm: Normal rate and regular rhythm.     Heart sounds: Normal heart sounds.   Pulmonary:     Effort: Pulmonary effort is normal.     Breath sounds: Normal breath sounds.  Musculoskeletal:     Right lower leg: No edema.     Left lower leg: No edema.  Neurological:     Mental Status: He is alert.    Diabetic Foot Form - Detailed   Diabetic Foot Exam - detailed Diabetic Foot exam was performed with the following findings: Yes 07/08/2022  2:35 PM  Can the patient see the bottom of their feet?: Yes Is there swelling or and abnormal foot shape?: No Is there a claw toe deformity?: No Is there elevated skin temparature?: No Pulse Foot Exam completed.: Yes   Right posterior Tibialias: Present Left posterior Tibialias: Present   Right Dorsalis Pedis: Present Left Dorsalis Pedis: Present  Sensory Foot Exam Completed.: Yes Semmes-Weinstein Monofilament Test   Comments: All 10 sites bilaterally       Results for orders placed or performed in visit on 07/08/22  POCT glycosylated hemoglobin (Hb A1C)  Result Value Ref Range   Hemoglobin A1C 11.4 (A) 4.0 - 5.6 %   HbA1c POC (<> result, manual entry)     HbA1c, POC (prediabetic range)  HbA1c, POC (controlled diabetic range)        The ASCVD Risk score (Arnett DK, et al., 2019) failed to calculate for the following reasons:   The 2019 ASCVD risk score is only valid for ages 50 to 27    Assessment & Plan:   Problem List Items Addressed This Visit       Cardiovascular and Mediastinum   Hypertension associated with diabetes (HCC)    Has several readings in the office with elevated blood pressure and pulse.  He is currently on lisinopril 5 mg for renal protection.  Upon recheck blood pressure still elevated given several readings in the past we will like to treat will increase lisinopril from 5 mg to 10 mg daily.  Patient alerted and new prescription sent to pharmacy.      Relevant Medications   lisinopril (ZESTRIL) 10 MG tablet   Semaglutide,0.25 or 0.5MG /DOS, (OZEMPIC, 0.25 OR 0.5 MG/DOSE,) 2 MG/3ML SOPN    Semaglutide,0.25 or 0.5MG /DOS, (OZEMPIC, 0.25 OR 0.5 MG/DOSE,) 2 MG/3ML SOPN     Endocrine   Uncontrolled type 2 diabetes mellitus with hyperglycemia (HCC) - Primary    Patient is nonadherent to medication therapy daily.  Did encourage patient to take medications every day as prescribed.  He did start with Ozempic but then ran out and had a trip to [place] and did not get a refill.  We will start patient back on Ozempic 0.25 mg once weekly for 4 weeks then titrate to 0.5 mg once a week for 4 weeks and then up to 1 mg thereafter.  If he is able to find the medication is too expensive he can reach out to the office we can change to one of the other GLP-1 medications      Relevant Medications   lisinopril (ZESTRIL) 10 MG tablet   Semaglutide,0.25 or 0.5MG /DOS, (OZEMPIC, 0.25 OR 0.5 MG/DOSE,) 2 MG/3ML SOPN   Semaglutide,0.25 or 0.5MG /DOS, (OZEMPIC, 0.25 OR 0.5 MG/DOSE,) 2 MG/3ML SOPN   Other Relevant Orders   POCT glycosylated hemoglobin (Hb A1C) (Completed)    Return in about 3 months (around 10/07/2022) for DM recheck.    Audria Nine, NP

## 2022-07-08 NOTE — Assessment & Plan Note (Signed)
Patient is nonadherent to medication therapy daily.  Did encourage patient to take medications every day as prescribed.  He did start with Ozempic but then ran out and had a trip to [place] and did not get a refill.  We will start patient back on Ozempic 0.25 mg once weekly for 4 weeks then titrate to 0.5 mg once a week for 4 weeks and then up to 1 mg thereafter.  If he is able to find the medication is too expensive he can reach out to the office we can change to one of the other GLP-1 medications

## 2022-07-08 NOTE — Assessment & Plan Note (Signed)
Has several readings in the office with elevated blood pressure and pulse.  He is currently on lisinopril 5 mg for renal protection.  Upon recheck blood pressure still elevated given several readings in the past we will like to treat will increase lisinopril from 5 mg to 10 mg daily.  Patient alerted and new prescription sent to pharmacy.

## 2022-07-22 ENCOUNTER — Encounter: Payer: Self-pay | Admitting: Nurse Practitioner

## 2022-07-22 DIAGNOSIS — R869 Unspecified abnormal finding in specimens from male genital organs: Secondary | ICD-10-CM

## 2022-07-23 NOTE — Telephone Encounter (Signed)
Checked last three notes of [place], and no mention of this so will leave for [facility] to decide if he needs appt prior to being seen.

## 2022-08-12 ENCOUNTER — Ambulatory Visit (INDEPENDENT_AMBULATORY_CARE_PROVIDER_SITE_OTHER): Payer: BC Managed Care – PPO | Admitting: Urology

## 2022-08-12 ENCOUNTER — Encounter: Payer: Self-pay | Admitting: Urology

## 2022-08-12 VITALS — BP 145/105 | HR 118 | Ht 68.0 in | Wt 172.0 lb

## 2022-08-12 DIAGNOSIS — N5319 Other ejaculatory dysfunction: Secondary | ICD-10-CM | POA: Diagnosis not present

## 2022-08-12 DIAGNOSIS — R7989 Other specified abnormal findings of blood chemistry: Secondary | ICD-10-CM | POA: Diagnosis not present

## 2022-08-12 DIAGNOSIS — R6882 Decreased libido: Secondary | ICD-10-CM | POA: Diagnosis not present

## 2022-08-12 NOTE — Progress Notes (Signed)
   08/12/2022 10:29 AM   Tonna Boehringer May 09, 1991 [id number]  Referring provider: Michela Pitcher, NP [place],  [place] [address]  Chief Complaint  Patient presents with   Other    HPI: Sean Gonzalez is a 31 y.o. male referred for evaluation of abnormal ejaculation.  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 If you have any questions, ask your nurse or doctor.          glipiZIDE 10 MG tablet Commonly known as: GLUCOTROL Take 1 tablet (10 mg total) by mouth 2 (two) times daily before a meal.   lisinopril 10 MG tablet Commonly known as: ZESTRIL Take 1 tablet (10 mg total) by mouth daily.   metFORMIN 1000 MG tablet Commonly known as: GLUCOPHAGE Take 1 tablet (1,000 mg total) by mouth 2 (two) times daily with a meal.   Ozempic (0.25 or 0.5 MG/DOSE) 2 MG/3ML Sopn Generic drug: Semaglutide(0.25 or 0.5MG /DOS) Inject 0.25 mg into the skin once a week. For 4 weeks then titrate up to 0.5mg    Ozempic (0.25 or 0.5 MG/DOSE) 2 MG/3ML Sopn Generic drug: Semaglutide(0.25 or 0.5MG /DOS) Inject 0.5 mg into the skin once a week. After you complete the 0.25mg  prescription         Allergies: No Known Allergies  Family History: Family History  Problem Relation Age of Onset   Diabetes Mother    Stomach cancer Neg Hx    Colon cancer Neg Hx    Pancreatic cancer Neg Hx     Social History:  reports that he has never smoked. He has never used smokeless tobacco. He reports that he does not currently use alcohol. He reports that he does not use drugs.   Physical Exam: BP (!) 145/105   Pulse (!) 118   Ht 5\' 8"  (1.727 m)   Wt 172 lb (78 kg)   BMI 26.15 kg/m   Constitutional:  Alert and oriented, No acute distress. HEENT: [place] AT Respiratory: Normal respiratory effort, no increased work of breathing. GU: Phallus without lesions.  Right testis volume estimated 10 cc and left 6 cc Skin: No rashes, bruises or suspicious lesions. Neurologic: Grossly intact, no focal deficits, moving all 4 extremities. Psychiatric: Normal mood and affect.   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  Testis atrophy As above   Abbie Sons, MD  [facility] [address], [place], [place] [address] [phone]

## 2022-08-13 LAB — TESTOSTERONE: Testosterone: 300 ng/dL (ref 264–916)

## 2022-08-13 LAB — LUTEINIZING HORMONE: LH: 6 m[IU]/mL (ref 1.7–8.6)

## 2022-08-16 ENCOUNTER — Encounter: Payer: Self-pay | Admitting: *Deleted

## 2022-08-22 ENCOUNTER — Other Ambulatory Visit: Payer: Self-pay | Admitting: Urology

## 2022-08-22 DIAGNOSIS — N5319 Other ejaculatory dysfunction: Secondary | ICD-10-CM

## 2022-08-25 ENCOUNTER — Other Ambulatory Visit: Payer: Self-pay | Admitting: Urology

## 2022-08-31 ENCOUNTER — Other Ambulatory Visit: Payer: Self-pay | Admitting: Nurse Practitioner

## 2022-08-31 DIAGNOSIS — E1165 Type 2 diabetes mellitus with hyperglycemia: Secondary | ICD-10-CM

## 2022-09-01 ENCOUNTER — Other Ambulatory Visit: Payer: Self-pay | Admitting: Nurse Practitioner

## 2022-09-01 DIAGNOSIS — E1165 Type 2 diabetes mellitus with hyperglycemia: Secondary | ICD-10-CM

## 2022-09-01 MED ORDER — SEMAGLUTIDE (1 MG/DOSE) 4 MG/3ML ~~LOC~~ SOPN: 1.0000 mg | PEN_INJECTOR | SUBCUTANEOUS | 0 refills | Status: DC

## 2022-10-07 ENCOUNTER — Ambulatory Visit: Payer: BC Managed Care – PPO | Admitting: Nurse Practitioner

## 2022-10-14 ENCOUNTER — Ambulatory Visit (INDEPENDENT_AMBULATORY_CARE_PROVIDER_SITE_OTHER): Payer: BC Managed Care – PPO | Admitting: Nurse Practitioner

## 2022-10-14 ENCOUNTER — Other Ambulatory Visit: Payer: Self-pay | Admitting: Nurse Practitioner

## 2022-10-14 ENCOUNTER — Encounter: Payer: Self-pay | Admitting: Nurse Practitioner

## 2022-10-14 VITALS — BP 128/78 | HR 118 | Temp 97.8°F | Ht 68.0 in | Wt 177.8 lb

## 2022-10-14 DIAGNOSIS — E1165 Type 2 diabetes mellitus with hyperglycemia: Secondary | ICD-10-CM

## 2022-10-14 DIAGNOSIS — E1159 Type 2 diabetes mellitus with other circulatory complications: Secondary | ICD-10-CM

## 2022-10-14 DIAGNOSIS — E1169 Type 2 diabetes mellitus with other specified complication: Secondary | ICD-10-CM | POA: Diagnosis not present

## 2022-10-14 DIAGNOSIS — I152 Hypertension secondary to endocrine disorders: Secondary | ICD-10-CM

## 2022-10-14 DIAGNOSIS — E785 Hyperlipidemia, unspecified: Secondary | ICD-10-CM

## 2022-10-14 LAB — CBC
HCT: 42.1 % (ref 39.0–52.0)
Hemoglobin: 14.6 g/dL (ref 13.0–17.0)
MCHC: 34.6 g/dL (ref 30.0–36.0)
MCV: 88 fl (ref 78.0–100.0)
Platelets: 271 10*3/uL (ref 150.0–400.0)
RBC: 4.79 Mil/uL (ref 4.22–5.81)
RDW: 14.5 % (ref 11.5–15.5)
WBC: 7.5 10*3/uL (ref 4.0–10.5)

## 2022-10-14 LAB — POCT GLYCOSYLATED HEMOGLOBIN (HGB A1C): Hemoglobin A1C: 7.7 % — AB (ref 4.0–5.6)

## 2022-10-14 LAB — COMPREHENSIVE METABOLIC PANEL
ALT: 47 U/L (ref 0–53)
AST: 26 U/L (ref 0–37)
Albumin: 4.5 g/dL (ref 3.5–5.2)
Alkaline Phosphatase: 111 U/L (ref 39–117)
BUN: 11 mg/dL (ref 6–23)
CO2: 31 mEq/L (ref 19–32)
Calcium: 9.7 mg/dL (ref 8.4–10.5)
Chloride: 101 mEq/L (ref 96–112)
Creatinine, Ser: 0.63 mg/dL (ref 0.40–1.50)
GFR: 126.88 mL/min (ref >60.00)
Glucose, Bld: 166 mg/dL — ABNORMAL HIGH (ref 70–99)
Potassium: 3.9 mEq/L (ref 3.5–5.1)
Sodium: 138 mEq/L (ref 135–145)
Total Bilirubin: 1.1 mg/dL (ref 0.2–1.2)
Total Protein: 7.7 g/dL (ref 6.0–8.3)

## 2022-10-14 LAB — LIPID PANEL
Cholesterol: 180 mg/dL (ref 0–200)
HDL: 63.6 mg/dL (ref >39.00)
LDL Cholesterol: 96 mg/dL (ref 0–99)
NonHDL: 116.71
Total CHOL/HDL Ratio: 3
Triglycerides: 105 mg/dL (ref 0.0–149.0)
VLDL: 21 mg/dL (ref 0.0–40.0)

## 2022-10-14 MED ORDER — METFORMIN HCL 1000 MG PO TABS: 1000.0000 mg | ORAL_TABLET | Freq: Two times a day (BID) | ORAL | 1 refills | Status: DC

## 2022-10-14 MED ORDER — SEMAGLUTIDE (1 MG/DOSE) 4 MG/3ML ~~LOC~~ SOPN: 1.0000 mg | PEN_INJECTOR | SUBCUTANEOUS | 1 refills | Status: DC

## 2022-10-14 NOTE — Assessment & Plan Note (Signed)
Patient currently maintained on lisinopril 10 mg daily.  Blood pressure within normal limits today.  Patient seems to be tolerating medication well.  Continue lisinopril 10 mg daily

## 2022-10-14 NOTE — Patient Instructions (Signed)
Nice to see you today Your A1C is 7.7%, that is great. I am proud of you. Keep up the good work I want to see you in 3 months, sooner if you need me I will be in touch with the labs once I have the results

## 2022-10-14 NOTE — Assessment & Plan Note (Signed)
Patient currently maintained on semaglutide, metformin, glipizide.  A1c 7.7% in office today.  Praise given to patient on his hard work.  Continue medication as prescribed no changes made today will follow-up in 3 months for A1c recheck

## 2022-10-14 NOTE — Progress Notes (Signed)
Established Patient Office Visit  Subjective   Patient ID: Sean Gonzalez, male    DOB: 05/06/1991  Age: 31 y.o. MRN: [id number]  Chief Complaint  Patient presents with   Diabetes    a1c      DM2 patient here for diabetes follow-up.  He is currently maintained on semaglutide, glipizide, metformin.  He does check his sugars infrequently at home. States that he has had 189,214. Highest was 267.  States that he did have some stomach issues in the beginning with the ozempic. States that it is leveling out.   Exercises: states "hear and there". Not as consistent. States at work he will walk some laps around the building. Has not been doing it over the past 3 weeks. States went to [employer] for snow boarding this past weekend  HTN: Patient currently maintained on lisinopril 10 mg daily for blood pressure and renal protection secondary to diabetes.  Anejaculatory: Patient is being followed by your allergist Dr. Bernardo Heater.  He has been evaluated and has a CT abdomen pelvis with contrast set up which has not been completed yet.    Review of Systems  Constitutional:  Negative for chills and fever.  Respiratory:  Negative for shortness of breath.   Cardiovascular:  Negative for chest pain.  Gastrointestinal:  Negative for abdominal pain, nausea and vomiting.       BM daily.   Genitourinary:  Negative for dysuria and hematuria.  Neurological:  Negative for tingling and headaches.      Objective:     BP 128/78   Pulse (!) 118   Temp 97.8 F (36.6 C) (Oral)   Ht 5\' 8"  (1.727 m)   Wt 177 lb 12.8 oz (80.6 kg)   SpO2 98%   BMI 27.03 kg/m  BP Readings from Last 3 Encounters:  10/14/22 128/78  08/12/22 (!) 145/105  07/08/22 (!) 138/94   Wt Readings from Last 3 Encounters:  10/14/22 177 lb 12.8 oz (80.6 kg)  08/12/22 172 lb (78 kg)  07/08/22 170 lb (77.1 kg)      Physical Exam Vitals and nursing note reviewed.  Constitutional:      Appearance: Normal appearance.   Cardiovascular:     Rate and Rhythm: Normal rate and regular rhythm.     Pulses:          Dorsalis pedis pulses are 2+ on the right side and 2+ on the left side.       Posterior tibial pulses are 2+ on the right side and 2+ on the left side.     Heart sounds: Normal heart sounds.  Pulmonary:     Effort: Pulmonary effort is normal.     Breath sounds: Normal breath sounds.  Abdominal:     General: Bowel sounds are normal. There is no distension.     Palpations: There is no mass.     Tenderness: There is no abdominal tenderness.     Hernia: No hernia is present.  Musculoskeletal:     Right lower leg: No edema.     Left lower leg: No edema.       Feet:  Feet:     Right foot:     Skin integrity: Skin integrity normal.     Left foot:     Skin integrity: Skin integrity normal.  Skin:    General: Skin is warm.  Neurological:     Mental Status: He is alert.      Results for orders placed  or performed in visit on 10/14/22  POCT glycosylated hemoglobin (Hb A1C)  Result Value Ref Range   Hemoglobin A1C 7.7 (A) 4.0 - 5.6 %   HbA1c POC (<> result, manual entry)     HbA1c, POC (prediabetic range)     HbA1c, POC (controlled diabetic range)        The ASCVD Risk score (Arnett DK, et al., 2019) failed to calculate for the following reasons:   The 2019 ASCVD risk score is only valid for ages 37 to 58    Assessment & Plan:   Problem List Items Addressed This Visit       Cardiovascular and Mediastinum   Hypertension associated with diabetes [facility])    Patient currently maintained on lisinopril 10 mg daily.  Blood pressure within normal limits today.  Patient seems to be tolerating medication well.  Continue lisinopril 10 mg daily      Relevant Medications   Semaglutide, 1 MG/DOSE, 4 MG/3ML SOPN   metFORMIN (GLUCOPHAGE) 1000 MG tablet   Other Relevant Orders   CBC   Lipid panel   Comprehensive metabolic panel     Endocrine   Hyperlipidemia associated with type 2 diabetes  mellitus (HCC)    Patient has had a little weight gain as of late.  Patient states he has noticed this.  Not exercising as much as he has been in the past.  Encouraged healthy lifestyle modifications pending labs today.      Relevant Medications   Semaglutide, 1 MG/DOSE, 4 MG/3ML SOPN   metFORMIN (GLUCOPHAGE) 1000 MG tablet   Other Relevant Orders   Lipid panel   Uncontrolled type 2 diabetes mellitus with hyperglycemia (HCC) - Primary    Patient currently maintained on semaglutide, metformin, glipizide.  A1c 7.7% in office today.  Praise given to patient on his hard work.  Continue medication as prescribed no changes made today will follow-up in 3 months for A1c recheck      Relevant Medications   Semaglutide, 1 MG/DOSE, 4 MG/3ML SOPN   metFORMIN (GLUCOPHAGE) 1000 MG tablet   Other Relevant Orders   POCT glycosylated hemoglobin (Hb A1C) (Completed)   CBC   Lipid panel   Comprehensive metabolic panel    Return in about 3 months (around 01/13/2023) for DM recheck .    Audria Nine, NP

## 2022-10-14 NOTE — Assessment & Plan Note (Signed)
Patient has had a little weight gain as of late.  Patient states he has noticed this.  Not exercising as much as he has been in the past.  Encouraged healthy lifestyle modifications pending labs today.

## 2022-12-09 ENCOUNTER — Ambulatory Visit (INDEPENDENT_AMBULATORY_CARE_PROVIDER_SITE_OTHER): Payer: BC Managed Care – PPO | Admitting: Nurse Practitioner

## 2022-12-09 ENCOUNTER — Encounter: Payer: Self-pay | Admitting: Nurse Practitioner

## 2022-12-09 VITALS — BP 120/82 | HR 103 | Temp 98.2°F | Resp 16 | Ht 68.0 in | Wt 175.1 lb

## 2022-12-09 DIAGNOSIS — B353 Tinea pedis: Secondary | ICD-10-CM

## 2022-12-09 DIAGNOSIS — B354 Tinea corporis: Secondary | ICD-10-CM

## 2022-12-09 MED ORDER — TERBINAFINE HCL 250 MG PO TABS: 250.0000 mg | ORAL_TABLET | Freq: Every day | ORAL | 0 refills | Status: DC

## 2022-12-09 NOTE — Patient Instructions (Signed)
Nice to see you today Make a lab visit for 2 weeks to check liver functions Make that in person if the spots do not start healing

## 2022-12-09 NOTE — Progress Notes (Signed)
Acute Office Visit  Subjective:     Patient ID: Sean Gonzalez, male    DOB: 1991-02-13, 32 y.o.   MRN: [id number]  Chief Complaint  Patient presents with   Rash    Sholder and arms   Foot Problem    Thinks that the infection on his foot is back.     Patient is in today for Skin concerns   Rash: started approx 2-2.5 weeks ago. States that there are several lesions. States that it does itch. He has not tired any creams. States that he has used peroxide and alcohol wipes States no exposure to animals or extended exposure to moisture   Right foot area: States that it has been present for 2.5 weeks. States that he has had a fungal infection in the past that was treated with terbidifen. States that it is itching. States that he has used alcohol and peroxide.  Review of Systems  Constitutional:  Negative for chills and fever.  Respiratory:  Negative for shortness of breath.   Cardiovascular:  Negative for chest pain.  Skin:  Positive for rash.        Objective:    BP 120/82   Pulse (!) 103   Temp 98.2 F (36.8 C)   Resp 16   Ht 5\' 8"  (1.727 m)   Wt 175 lb 2 oz (79.4 kg)   SpO2 99%   BMI 26.63 kg/m    Physical Exam Vitals and nursing note reviewed.  Constitutional:      Appearance: Normal appearance.  Cardiovascular:     Rate and Rhythm: Normal rate and regular rhythm.     Heart sounds: Normal heart sounds.  Pulmonary:     Effort: Pulmonary effort is normal.     Breath sounds: Normal breath sounds.  Skin:    General: Skin is warm.     Findings: Rash present.          Comments: See clinical photo  Neurological:     Mental Status: He is alert.          No results found for any visits on 12/09/22.      Assessment & Plan:   Problem List Items Addressed This Visit       Musculoskeletal and Integument   Tinea pedis of right foot     fungal rash.  Discouraged patient from using alcohol and hydroperoxide a longer.  Will do terbinafine to 250 mg  daily for 2 weeks will check LFTs post therapy previous LFTs within normal limits in December.  He will change lab appointment to office visit if no improvement      Relevant Medications   terbinafine (LAMISIL) 250 MG tablet   Tinea corporis - Primary     fungal rash.  Discouraged patient from using alcohol and hydroperoxide a longer.  Will do terbinafine to 250 mg daily for 2 weeks will check LFTs post therapy previous LFTs within normal limits in December.  He will change lab appointment to office visit if no improvement      Relevant Medications   terbinafine (LAMISIL) 250 MG tablet    Meds ordered this encounter  Medications   terbinafine (LAMISIL) 250 MG tablet    Sig: Take 1 tablet (250 mg total) by mouth daily.    Dispense:  14 tablet    Refill:  0    Order Specific Question:   Supervising Provider    Answer:   Loura Pardon A [[id number]    Return  in about 2 weeks (around 12/23/2022) for Lab visit only .  Romilda Garret, NP

## 2022-12-09 NOTE — Assessment & Plan Note (Signed)
fungal rash.  Discouraged patient from using alcohol and hydroperoxide a longer.  Will do terbinafine to 250 mg daily for 2 weeks will check LFTs post therapy previous LFTs within normal limits in December.  He will change lab appointment to office visit if no improvement 

## 2022-12-09 NOTE — Assessment & Plan Note (Signed)
fungal rash.  Discouraged patient from using alcohol and hydroperoxide a longer.  Will do terbinafine to 250 mg daily for 2 weeks will check LFTs post therapy previous LFTs within normal limits in December.  He will change lab appointment to office visit if no improvement

## 2022-12-23 ENCOUNTER — Other Ambulatory Visit (INDEPENDENT_AMBULATORY_CARE_PROVIDER_SITE_OTHER): Payer: BC Managed Care – PPO

## 2022-12-23 DIAGNOSIS — B353 Tinea pedis: Secondary | ICD-10-CM | POA: Diagnosis not present

## 2022-12-23 DIAGNOSIS — B354 Tinea corporis: Secondary | ICD-10-CM | POA: Diagnosis not present

## 2022-12-23 LAB — HEPATIC FUNCTION PANEL
ALT: 63 U/L — ABNORMAL HIGH (ref 0–53)
AST: 28 U/L (ref 0–37)
Albumin: 4.3 g/dL (ref 3.5–5.2)
Alkaline Phosphatase: 142 U/L — ABNORMAL HIGH (ref 39–117)
Bilirubin, Direct: 0.1 mg/dL (ref 0.0–0.3)
Total Bilirubin: 0.8 mg/dL (ref 0.2–1.2)
Total Protein: 7.5 g/dL (ref 6.0–8.3)

## 2022-12-25 ENCOUNTER — Encounter: Payer: Self-pay | Admitting: Urology

## 2022-12-27 ENCOUNTER — Other Ambulatory Visit: Payer: Self-pay | Admitting: Urology

## 2022-12-27 ENCOUNTER — Other Ambulatory Visit: Payer: Self-pay | Admitting: Family Medicine

## 2022-12-27 DIAGNOSIS — N5319 Other ejaculatory dysfunction: Secondary | ICD-10-CM

## 2022-12-27 NOTE — Telephone Encounter (Signed)
Stoioff, Ronda Fairly, MD  You1 hour ago (10:25 AM)    The order  placed for CT scan.  If this is not covered by his insurance company I would recommend referral to Dr. Cain Sieve at [facility] who is an infertility sub specialist.    [facility] informed patient order has been placed and imaging will contact to get scheduled, if it will be covered by insurance.

## 2023-01-06 ENCOUNTER — Ambulatory Visit: Payer: BC Managed Care – PPO | Admitting: Nurse Practitioner

## 2023-01-07 ENCOUNTER — Encounter: Payer: Self-pay | Admitting: Nurse Practitioner

## 2023-01-07 ENCOUNTER — Ambulatory Visit: Payer: BC Managed Care – PPO | Admitting: Nurse Practitioner

## 2023-01-13 ENCOUNTER — Ambulatory Visit (INDEPENDENT_AMBULATORY_CARE_PROVIDER_SITE_OTHER): Payer: BC Managed Care – PPO | Admitting: Nurse Practitioner

## 2023-01-13 ENCOUNTER — Ambulatory Visit: Payer: BC Managed Care – PPO

## 2023-01-13 ENCOUNTER — Encounter: Payer: Self-pay | Admitting: *Deleted

## 2023-01-13 ENCOUNTER — Encounter: Payer: Self-pay | Admitting: Nurse Practitioner

## 2023-01-13 VITALS — BP 128/80 | HR 107 | Temp 98.2°F | Resp 16 | Ht 68.0 in | Wt 174.2 lb

## 2023-01-13 DIAGNOSIS — B354 Tinea corporis: Secondary | ICD-10-CM | POA: Diagnosis not present

## 2023-01-13 DIAGNOSIS — R21 Rash and other nonspecific skin eruption: Secondary | ICD-10-CM

## 2023-01-13 DIAGNOSIS — I152 Hypertension secondary to endocrine disorders: Secondary | ICD-10-CM

## 2023-01-13 DIAGNOSIS — E1159 Type 2 diabetes mellitus with other circulatory complications: Secondary | ICD-10-CM

## 2023-01-13 DIAGNOSIS — B353 Tinea pedis: Secondary | ICD-10-CM | POA: Diagnosis not present

## 2023-01-13 DIAGNOSIS — E1165 Type 2 diabetes mellitus with hyperglycemia: Secondary | ICD-10-CM

## 2023-01-13 LAB — POCT GLYCOSYLATED HEMOGLOBIN (HGB A1C): Hemoglobin A1C: 10.2 % — AB (ref 4.0–5.6)

## 2023-01-13 MED ORDER — SEMAGLUTIDE (2 MG/DOSE) 8 MG/3ML ~~LOC~~ SOPN: 2.0000 mg | PEN_INJECTOR | SUBCUTANEOUS | 0 refills | Status: DC

## 2023-01-13 NOTE — Assessment & Plan Note (Signed)
Patient's A1c went from 7.7-10.2.  Patient to continue metformin 1000 mg twice daily, glipizide 10 mg twice daily we will increase Ozempic from 1 mg weekly to 2 mg weekly new prescription sent to pharmacy.  Follow-up 3 months

## 2023-01-13 NOTE — Progress Notes (Signed)
Acute Office Visit  Subjective:     Patient ID: Sean Gonzalez, male    DOB: 04/01/1991, 32 y.o.   MRN: [id number]  Chief Complaint  Patient presents with   Tinea    Has not cleared up from the last time you seen him. Shoulder, stomach , and foot.    HPI Patient is in today for skin issues 10.2  He was seen by me on and was prescribed lamisil and took all the medication and states that it has not helped any. States that he does not have a Paediatric nurse. States that his   DM2: he is currently on metformin 1 g twice daily, glipizide 10 mg twice daily, Ozempic 1 mg weekly States that he is checking his suagras twice a week. 190 230 250, States that he is taking medications as prescribed. States that did miss a week or two with the ozempic because of the rash on his stomach.    Review of Systems  Constitutional:  Negative for chills and fever.  Respiratory:  Negative for shortness of breath.   Cardiovascular:  Negative for chest pain.  Gastrointestinal:  Positive for diarrhea. Negative for abdominal pain, constipation, nausea and vomiting.       BM daily   Skin:  Positive for itching and rash.  Neurological:  Negative for headaches.  Psychiatric/Behavioral:  Negative for hallucinations and suicidal ideas.         Objective:    BP 128/80   Pulse (!) 107   Temp 98.2 F (36.8 C)   Resp 16   Ht '5\' 8"'$  (1.727 m)   Wt 174 lb 4 oz (79 kg)   SpO2 98%   BMI 26.49 kg/m  BP Readings from Last 3 Encounters:  01/13/23 128/80  12/09/22 120/82  10/14/22 128/78   Wt Readings from Last 3 Encounters:  01/13/23 174 lb 4 oz (79 kg)  12/09/22 175 lb 2 oz (79.4 kg)  10/14/22 177 lb 12.8 oz (80.6 kg)      Physical Exam Vitals and nursing note reviewed.  Constitutional:      Appearance: Normal appearance.  Cardiovascular:     Rate and Rhythm: Normal rate and regular rhythm.     Heart sounds: Normal heart sounds.  Pulmonary:     Effort: Pulmonary effort is normal.      Breath sounds: Normal breath sounds.  Abdominal:     General: Bowel sounds are normal.  Musculoskeletal:     Right lower leg: No edema.     Left lower leg: No edema.  Skin:    Findings: Lesion and rash present.  Neurological:     Mental Status: He is alert.          Results for orders placed or performed in visit on 01/13/23  POCT glycosylated hemoglobin (Hb A1C)  Result Value Ref Range   Hemoglobin A1C 10.2 (A) 4.0 - 5.6 %   HbA1c POC (<> result, manual entry)     HbA1c, POC (prediabetic range)     HbA1c, POC (controlled diabetic range)          Assessment & Plan:   Problem List Items Addressed This Visit       Cardiovascular and Mediastinum   Hypertension associated with diabetes ([place])    Patient currently maintained on lisinopril 10 mg.  Blood pressure well-controlled.  Continue taking medication as prescribed      Relevant Medications   Semaglutide, 2 MG/DOSE, 8 MG/3ML SOPN  Endocrine   Uncontrolled type 2 diabetes mellitus with hyperglycemia ([place])    Patient's A1c went from 7.7-10.2.  Patient to continue metformin 1000 mg twice daily, glipizide 10 mg twice daily we will increase Ozempic from 1 mg weekly to 2 mg weekly new prescription sent to pharmacy.  Follow-up 3 months      Relevant Medications   Semaglutide, 2 MG/DOSE, 8 MG/3ML SOPN   Other Relevant Orders   POCT glycosylated hemoglobin (Hb A1C) (Completed)     Musculoskeletal and Integument   Tinea pedis of right foot - Primary   Relevant Orders   Ambulatory referral to Dermatology   Tinea corporis   Relevant Orders   Ambulatory referral to Dermatology   Rash    Patient has rash on bilateral upper arms chest abdomen and right foot.  Has not gotten any better with the use of oral antifungal.  Ambulatory referral to dermatology for further evaluation and treatment pictures taken in office today please see office note and clinical photos      Relevant Orders   Ambulatory referral to  Dermatology    Meds ordered this encounter  Medications   Semaglutide, 2 MG/DOSE, 8 MG/3ML SOPN    Sig: Inject 2 mg as directed once a week.    Dispense:  9 mL    Refill:  0    Order Specific Question:   Supervising Provider    Answer:   Loura Pardon A [[id number]]    Return in about 3 months (around 04/15/2023) for DM recheck.  Romilda Garret, NP

## 2023-01-13 NOTE — Patient Instructions (Signed)
Nice to see you today I am going to increase your ozempic to '2mg'$  once a week. You can wait until it is time to inject to start the 2 mg dose I am going to refer you to dermatology. They will call you  Follow up with me in 3 months for a diabetes recheck

## 2023-01-13 NOTE — Assessment & Plan Note (Signed)
Patient has rash on bilateral upper arms chest abdomen and right foot.  Has not gotten any better with the use of oral antifungal.  Ambulatory referral to dermatology for further evaluation and treatment pictures taken in office today please see office note and clinical photos

## 2023-01-13 NOTE — Assessment & Plan Note (Signed)
Patient currently maintained on lisinopril 10 mg.  Blood pressure well-controlled.  Continue taking medication as prescribed

## 2023-01-20 ENCOUNTER — Ambulatory Visit
Admission: RE | Admit: 2023-01-20 | Discharge: 2023-01-20 | Disposition: A | Discharge status: Home / Self Care | Payer: BC Managed Care – PPO | Source: Ambulatory Visit | Attending: Urology | Admitting: Urology

## 2023-01-20 DIAGNOSIS — N5313 Anejaculatory orgasm: Secondary | ICD-10-CM | POA: Diagnosis not present

## 2023-01-20 DIAGNOSIS — N5319 Other ejaculatory dysfunction: Secondary | ICD-10-CM

## 2023-01-24 ENCOUNTER — Encounter: Payer: Self-pay | Admitting: *Deleted

## 2023-02-02 ENCOUNTER — Ambulatory Visit (INDEPENDENT_AMBULATORY_CARE_PROVIDER_SITE_OTHER): Payer: BC Managed Care – PPO | Admitting: Dermatology

## 2023-02-02 VITALS — BP 145/95 | HR 111

## 2023-02-02 DIAGNOSIS — L299 Pruritus, unspecified: Secondary | ICD-10-CM

## 2023-02-02 DIAGNOSIS — D2262 Melanocytic nevi of left upper limb, including shoulder: Secondary | ICD-10-CM | POA: Diagnosis not present

## 2023-02-02 DIAGNOSIS — R21 Rash and other nonspecific skin eruption: Secondary | ICD-10-CM | POA: Diagnosis not present

## 2023-02-02 DIAGNOSIS — L308 Other specified dermatitis: Secondary | ICD-10-CM | POA: Diagnosis not present

## 2023-02-02 MED ORDER — MOMETASONE FUROATE 0.1 % EX OINT: TOPICAL_OINTMENT | CUTANEOUS | 0 refills | Status: DC

## 2023-02-02 NOTE — Patient Instructions (Addendum)
Wound Care Instructions  Cleanse wound gently with soap and water once a day then pat dry with clean gauze. Apply a thin coat of Petrolatum (petroleum jelly, "Vaseline") over the wound (unless you have an allergy to this). We recommend that you use a new, sterile tube of Vaseline. Do not pick or remove scabs. Do not remove the yellow or white "healing tissue" from the base of the wound.  Cover the wound with fresh, clean, nonstick gauze and secure with paper tape. You may use Band-Aids in place of gauze and tape if the wound is small enough, but would recommend trimming much of the tape off as there is often too much. Sometimes Band-Aids can irritate the skin.  You should call the office for your biopsy report after 1 week if you have not already been contacted.  If you experience any problems, such as abnormal amounts of bleeding, swelling, significant bruising, significant pain, or evidence of infection, please call the office immediately.  FOR ADULT SURGERY PATIENTS: If you need something for pain relief you may take 1 extra strength Tylenol (acetaminophen) AND 2 Ibuprofen (200mg  each) together every 4 hours as needed for pain. (do not take these if you are allergic to them or if you have a reason you should not take them.) Typically, you may only need pain medication for 1 to 3 days.     If You Need Anything After Your Visit  If you have any questions or concerns for your doctor, please call our main line at [phone] and press option 4 to reach your doctor's medical assistant. If no one answers, please leave a voicemail as directed and we will return your call as soon as possible. Messages left after 4 pm will be answered the following business day.   You may also send [place] a message via [place]. We typically respond to MyChart messages within 1-2 business days.  For prescription refills, please ask your pharmacy to contact our office. Our fax number is [phone].  If you have an  urgent issue when the clinic is closed that cannot wait until the next business day, you can page your doctor at the number below.    Please note that while we do our best to be available for urgent issues outside of office hours, we are not available 24/7.   If you have an urgent issue and are unable to reach [place], you may choose to seek medical care at your doctor's office, retail clinic, urgent care center, or emergency room.  If you have a medical emergency, please immediately call 911 or go to the emergency department.  Pager Numbers  - Dr. Nehemiah Massed: [phone]  - Dr. Laurence Ferrari: [phone]  - Dr. Nicole Kindred: [phone]  In the event of inclement weather, please call our main line at [phone] for an update on the status of any delays or closures.  Dermatology Medication Tips: Please keep the boxes that topical medications come in in order to help keep track of the instructions about where and how to use these. Pharmacies typically print the medication instructions only on the boxes and not directly on the medication tubes.   If your medication is too expensive, please contact our office at [phone] option 4 or send [place] a message through [place].   We are unable to tell what your co-pay for medications will be in advance as this is different depending on your insurance coverage. However, we may be able to find a substitute medication at lower cost or  fill out paperwork to get insurance to cover a needed medication.   If a prior authorization is required to get your medication covered by your insurance company, please allow [place] 1-2 business days to complete this process.  Drug prices often vary depending on where the prescription is filled and some pharmacies may offer cheaper prices.  The website www.goodrx.com contains coupons for medications through different pharmacies. The prices here do not account for what the cost may be with help from insurance (it may be cheaper with your  insurance), but the website can give you the price if you did not use any insurance.  - You can print the associated coupon and take it with your prescription to the pharmacy.  - You may also stop by our office during regular business hours and pick up a GoodRx coupon card.  - If you need your prescription sent electronically to a different pharmacy, notify our office through [facility] or by phone at [phone] option 4.     Si Usted Necesita Algo Despus de Su Visita  Tambin puede enviarnos un mensaje a travs de Pharmacist, community. Por lo general respondemos a los mensajes de MyChart en el transcurso de 1 a 2 das hbiles.  Para renovar recetas, por favor pida a su farmacia que se ponga en contacto con nuestra oficina. Harland Dingwall de fax es [place] [phone].  Si tiene un asunto urgente cuando la clnica est cerrada y que no puede esperar hasta el siguiente da hbil, puede llamar/localizar a su doctor(a) al nmero que aparece a continuacin.   Por favor, tenga en cuenta que aunque hacemos todo lo posible para estar disponibles para asuntos urgentes fuera del horario de [place], no estamos disponibles las 24 horas del da, los 7 das de la [place].   Si tiene un problema urgente y no puede comunicarse con nosotros, puede optar por buscar atencin mdica  en el consultorio de su doctor(a), en una clnica privada, en un centro de atencin urgente o en una sala de emergencias.  Si tiene Engineering geologist, por favor llame inmediatamente al 911 o vaya a la sala de emergencias.  Nmeros de bper  - Dr. Nehemiah Massed: [phone]  - Dra. Moye: [phone]  - Dra. Nicole Kindred: [phone]  En caso de inclemencias del [place], por favor llame a Johnsie Kindred principal al [phone] para una actualizacin sobre el [place] de cualquier retraso o cierre.  Consejos para la medicacin en dermatologa: Por favor, guarde las cajas en las que vienen los medicamentos de uso tpico para ayudarle a  seguir las instrucciones sobre dnde y cmo usarlos. Las farmacias generalmente imprimen las instrucciones del medicamento slo en las cajas y no directamente en los tubos del [place].   Si su medicamento es muy caro, por favor, pngase en contacto con Zigmund Daniel llamando al [phone] y presione la opcin 4 o envenos un mensaje a travs de Pharmacist, community.   No podemos decirle cul ser su copago por los medicamentos por adelantado ya que esto es diferente dependiendo de la cobertura de su seguro. Sin embargo, es posible que podamos encontrar un medicamento sustituto a Electrical engineer un formulario para que el seguro cubra el medicamento que se considera necesario.   Si se requiere una autorizacin previa para que su compaa de seguros [place] su medicamento, por favor permtanos de 1 a 2 das hbiles para completar este proceso.  Los precios de los medicamentos varan con frecuencia dependiendo del Environmental consultant de dnde se surte la receta  y [place] farmacias pueden ofrecer precios ms baratos.  El sitio web www.goodrx.com tiene cupones para medicamentos de Airline pilot. Los precios aqu no tienen en cuenta lo que podra costar con la ayuda del seguro (puede ser ms barato con su seguro), pero el sitio web puede darle el precio si no utiliz Research scientist (physical sciences).  - Puede imprimir el cupn correspondiente y llevarlo con su receta a la farmacia.  - Tambin puede pasar por nuestra oficina durante el horario de atencin regular y Charity fundraiser una tarjeta de cupones de GoodRx.  - Si necesita que su receta se enve electrnicamente a una farmacia diferente, informe a nuestra oficina a travs de MyChart de [place] o por telfono llamando al [phone] y presione la opcin 4.

## 2023-02-02 NOTE — Progress Notes (Signed)
   New Patient Visit   Subjective  Sean Gonzalez is a 32 y.o. male who presents for the following: Rash on the L arm/shoulder/axilla and abdomen that started about a month ago, itches worse at night. Dx with tinea by PCP and was prescribed Terbinafine 250 mg po QD x 2 weeks, but pt didn't see much of an improvement, other than less itch.   The following portions of the chart were reviewed this encounter and updated as appropriate: medications, allergies, medical history  Review of Systems:  No other skin or systemic complaints except as noted in HPI or Assessment and Plan.  Objective  Well appearing patient in no apparent distress; mood and affect are within normal limits.  A focused examination was performed of the following areas: The face, abdomen, L shoulder/arm  Relevant exam findings are noted in the Assessment and Plan.  R axilla (bx site), abdomen, axilla, upper arm Multiple hyperpigmented and pink scaly plaques of abdomen, axilla, upper arm          Assessment & Plan   Assessment & Plan   Rash and other nonspecific skin eruption R axilla (bx site), abdomen, axilla, upper arm  (-) KOH, ddx tinea, nummular derm, psoriasis, pityriasis rosea, SCLE> sarcoidosis   Skin / nail biopsy - R axilla (bx site), abdomen, axilla, upper arm Type of biopsy: punch   Informed consent: discussed and consent obtained   Timeout: patient name, date of birth, surgical site, and procedure verified   Procedure prep:  Patient was prepped and draped in usual sterile fashion (the patient was cleaned and prepped) Prep type:  Isopropyl alcohol Anesthesia: the lesion was anesthetized in a standard fashion   Anesthetic:  1% lidocaine w/ epinephrine 1-100,000 buffered w/ 8.4% NaHCO3 Punch size:  3.5 mm Suture size:  4-0 Suture type: Prolene (polypropylene)   Hemostasis achieved with: suture, pressure and aluminum chloride   Outcome: patient tolerated procedure well   Post-procedure  details: sterile dressing applied and wound care instructions given   Dressing type: bandage, petrolatum and pressure dressing    Specimen 1 - Surgical pathology Differential Diagnosis: ddx tinea, nummular derm, psoriasis, pityriasis rosea, SCLE> sarcoidosis Check Margins: No    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 Avoid applying to face, groin, and axilla. Use as directed. Long-term use can cause thinning of the skin. Topical steroids (such as triamcinolone, fluocinolone, fluocinonide, mometasone, clobetasol, halobetasol, betamethasone, hydrocortisone) can cause thinning and lightening of the skin if they are used for too long in the same area. Your physician has selected the right strength medicine for your problem and area affected on the body. Please use your medication only as directed by your physician to prevent side effects.    MELANOCYTIC NEVUS - Benign appearing on exam today. Recommend observation. Call clinic for new or changing moles. Recommend daily use of broad spectrum spf 30+ sunscreen to sun-exposed areas.   Exam  L ant shoulder, 0.4 cm dark brown thin papule with slightly irregular boarder. No change     Return in about 2 weeks (around 02/16/2023) for suture removal and follow up.  Luther Redo, CMA, am acting as scribe for Forest Gleason, MD .  Documentation: I have reviewed the above documentation for accuracy and completeness, and I agree with the above.  Forest Gleason, MD

## 2023-02-10 ENCOUNTER — Telehealth: Payer: Self-pay

## 2023-02-10 NOTE — Telephone Encounter (Signed)
Called patient. N/A. LMOVM to return my call.  

## 2023-02-10 NOTE — Telephone Encounter (Signed)
-----   Message from [place], MD sent at 02/10/2023 11:05 AM EDT ----- Skin , right axilla, abdomen axilla, upper arm PSORIASIFORM SPONGIOTIC DERMATITIS, SEE DESCRIPTION "thicker eczema type rash favored' --> continue mometasone until follow-up and we will recheck and discuss then.   MAs please call. Thank you!

## 2023-02-17 ENCOUNTER — Ambulatory Visit (INDEPENDENT_AMBULATORY_CARE_PROVIDER_SITE_OTHER): Payer: BC Managed Care – PPO | Admitting: Dermatology

## 2023-02-17 DIAGNOSIS — L810 Postinflammatory hyperpigmentation: Secondary | ICD-10-CM

## 2023-02-17 DIAGNOSIS — L300 Nummular dermatitis: Secondary | ICD-10-CM | POA: Diagnosis not present

## 2023-02-17 MED ORDER — OPZELURA 1.5 % EX CREA: TOPICAL_CREAM | CUTANEOUS | 5 refills | Status: DC

## 2023-02-17 NOTE — Patient Instructions (Addendum)
Recommend Kenton Kingfisher Lavonna Monarch, DO for primary care physician  [phone]   Due to recent changes in healthcare laws, you may see results of your pathology and/or laboratory studies on MyChart before the doctors have had a chance to review them. We understand that in some cases there may be results that are confusing or concerning to you. Please understand that not all results are received at the same time and often the doctors may need to interpret multiple results in order to provide you with the best plan of care or course of treatment. Therefore, we ask that you please give [place] 2 business days to thoroughly review all your results before contacting the office for clarification. Should we see a critical lab result, you will be contacted sooner.   If You Need Anything After Your Visit  If you have any questions or concerns for your doctor, please call our main line at [phone] and press option 4 to reach your doctor's medical assistant. If no one answers, please leave a voicemail as directed and we will return your call as soon as possible. Messages left after 4 pm will be answered the following business day.   You may also send [place] a message via MyChart. We typically respond to MyChart messages within 1-2 business days.  For prescription refills, please ask your pharmacy to contact our office. Our fax number is [phone].  If you have an urgent issue when the clinic is closed that cannot wait until the next business day, you can page your doctor at the number below.    Please note that while we do our best to be available for urgent issues outside of office hours, we are not available 24/7.   If you have an urgent issue and are unable to reach [place], you may choose to seek medical care at your doctor's office, retail clinic, urgent care center, or emergency room.  If you have a medical emergency, please immediately call 911 or go to the emergency department.  Pager  Numbers  - Dr. Gwen Pounds: [phone]  - Dr. Neale Burly: [phone]  - Dr. Roseanne Reno: [phone]  In the event of inclement weather, please call our main line at [phone] for an update on the status of any delays or closures.  Dermatology Medication Tips: Please keep the boxes that topical medications come in in order to help keep track of the instructions about where and how to use these. Pharmacies typically print the medication instructions only on the boxes and not directly on the medication tubes.   If your medication is too expensive, please contact our office at [phone] option 4 or send [place] a message through MyChart.   We are unable to tell what your co-pay for medications will be in advance as this is different depending on your insurance coverage. However, we may be able to find a substitute medication at lower cost or fill out paperwork to get insurance to cover a needed medication.   If a prior authorization is required to get your medication covered by your insurance company, please allow [place] 1-2 business days to complete this process.  Drug prices often vary depending on where the prescription is filled and some pharmacies may offer cheaper prices.  The website www.goodrx.com contains coupons for medications through different pharmacies. The prices here do not account for what the cost may be with help from insurance (it may be cheaper with your insurance), but the website can give you the price if you did not use  any insurance.  - You can print the associated coupon and take it with your prescription to the pharmacy.  - You may also stop by our office during regular business hours and pick up a GoodRx coupon card.  - If you need your prescription sent electronically to a different pharmacy, notify our office through [facility] or by phone at [phone] option 4.     Si Usted Necesita Algo Despus de Su Visita  Tambin puede enviarnos un mensaje a travs de  Pharmacist, community. Por lo general respondemos a los mensajes de MyChart en el transcurso de 1 a 2 das hbiles.  Para renovar recetas, por favor pida a su farmacia que se ponga en contacto con nuestra oficina. Harland Dingwall de fax es [place] [phone].  Si tiene un asunto urgente cuando la clnica est cerrada y que no puede esperar hasta el siguiente da hbil, puede llamar/localizar a su doctor(a) al nmero que aparece a continuacin.   Por favor, tenga en cuenta que aunque hacemos todo lo posible para estar disponibles para asuntos urgentes fuera del horario de [place], no estamos disponibles las 24 horas del da, los 7 das de la [place].   Si tiene un problema urgente y no puede comunicarse con nosotros, puede optar por buscar atencin mdica  en el consultorio de su doctor(a), en una clnica privada, en un centro de atencin urgente o en una sala de emergencias.  Si tiene Engineering geologist, por favor llame inmediatamente al 911 o vaya a la sala de emergencias.  Nmeros de bper  - Dr. Nehemiah Massed: [phone]  - Dra. Moye: [phone]  - Dra. Nicole Kindred: [phone]  En caso de inclemencias del [place], por favor llame a Johnsie Kindred principal al [phone] para una actualizacin sobre el [place] de cualquier retraso o cierre.  Consejos para la medicacin en dermatologa: Por favor, guarde las cajas en las que vienen los medicamentos de uso tpico para ayudarle a seguir las instrucciones sobre dnde y cmo usarlos. Las farmacias generalmente imprimen las instrucciones del medicamento slo en las cajas y no directamente en los tubos del [place].   Si su medicamento es muy caro, por favor, pngase en contacto con Zigmund Daniel llamando al [phone] y presione la opcin 4 o envenos un mensaje a travs de Pharmacist, community.   No podemos decirle cul ser su copago por los medicamentos por adelantado ya que esto es diferente dependiendo de la cobertura de su seguro. Sin embargo, es posible que  podamos encontrar un medicamento sustituto a Electrical engineer un formulario para que el seguro cubra el medicamento que se considera necesario.   Si se requiere una autorizacin previa para que su compaa de seguros [place] su medicamento, por favor permtanos de 1 a 2 das hbiles para completar este proceso.  Los precios de los medicamentos varan con frecuencia dependiendo del Environmental consultant de dnde se surte la receta y alguna farmacias pueden ofrecer precios ms baratos.  El sitio web www.goodrx.com tiene cupones para medicamentos de Airline pilot. Los precios aqu no tienen en cuenta lo que podra costar con la ayuda del seguro (puede ser ms barato con su seguro), pero el sitio web puede darle el precio si no utiliz Research scientist (physical sciences).  - Puede imprimir el cupn correspondiente y llevarlo con su receta a la farmacia.  - Tambin puede pasar por nuestra oficina durante el horario de atencin regular y Charity fundraiser una tarjeta de cupones de GoodRx.  - Si necesita que su receta se enve electrnicamente a  una farmacia diferente, informe a nuestra oficina a travs de MyChart de [place] o por telfono llamando al [phone] y presione la opcin 4.

## 2023-02-17 NOTE — Progress Notes (Signed)
   Follow-Up Visit   Subjective  Sean Gonzalez is a 32 y.o. male who presents for the following: suture removal, discuss pathology results and treatment options.    The following portions of the chart were reviewed this encounter and updated as appropriate: medications, allergies, medical history  Review of Systems:  No other skin or systemic complaints except as noted in HPI or Assessment and Plan.  Objective  Well appearing patient in no apparent distress; mood and affect are within normal limits.  A focused examination was performed of the following areas:   Relevant exam findings are noted in the Assessment and Plan.    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 BSA 10%  Exam: Scaly pink plaques with hyperpigmented patches  Treatment Plan:   Start opzelura cream twice a day to affected areas as needed. This should not be used long-term on more than 10% of your skin, and short term (up to 3 months) on up to 20% of your skin. If not covered, will send in tacrolimus ointment.   Encounter for Removal of Sutures - Incision site at the R chest/axilla is clean, dry and intact - Wound cleansed, sutures removed, wound cleansed and steri strips applied.  - Discussed pathology results showing psoriasiform spongiotic dermatitis consistent with nummular dermatitis  - Patient advised to keep steri-strips dry until they fall off. - Scars remodel for a full year. - Once steri-strips fall off, patient can apply over-the-counter silicone scar cream each night to help with scar remodeling if desired. - Patient advised to call with any concerns or if they notice any new or changing lesions.   POST-INFLAMMATORY HYPERPIGMENTATION (PIH) Exam: hyperpigmented macules and/or patches at face   This is a benign condition that comes from having previous inflammation in the skin and will fade with time over months to sometimes years.  Recommend daily sun protection including sunscreen SPF 30+ to sun-exposed areas. - Recommend treating any itchy or red areas on the skin quickly to prevent new areas of PIH. Treating with prescription medicines such as hydroquinone may help fade dark spots faster.    Treatment Plan: Recommend daily broad spectrum sunscreen SPF 30+ to sun-exposed areas, reapply every 2 hours as needed. Call for new or changing lesions.  Staying in the shade or wearing long sleeves, sun glasses (UVA+UVB protection) and wide brim hats (4-inch brim around the entire circumference of the hat) are also recommended for sun protection.   Return in about 3 months (around 05/19/2023) for nummular dermatitis follow up .  Maylene Roes, CMA, am acting as scribe for Darden Dates, MD .  Documentation: I have reviewed the above documentation for accuracy and completeness, and I agree with the above.  Darden Dates, MD

## 2023-02-22 ENCOUNTER — Encounter: Payer: Self-pay | Admitting: Dermatology

## 2023-04-15 ENCOUNTER — Ambulatory Visit: Payer: BC Managed Care – PPO | Admitting: Nurse Practitioner

## 2023-06-01 ENCOUNTER — Encounter (INDEPENDENT_AMBULATORY_CARE_PROVIDER_SITE_OTHER): Payer: Self-pay

## 2023-06-16 ENCOUNTER — Ambulatory Visit: Payer: BC Managed Care – PPO | Admitting: Nurse Practitioner

## 2023-06-17 ENCOUNTER — Encounter: Payer: Self-pay | Admitting: Nurse Practitioner

## 2023-06-23 ENCOUNTER — Ambulatory Visit: Payer: BC Managed Care – PPO | Admitting: Dermatology

## 2023-07-18 ENCOUNTER — Other Ambulatory Visit: Payer: Self-pay | Admitting: Nurse Practitioner

## 2023-07-18 DIAGNOSIS — I152 Hypertension secondary to endocrine disorders: Secondary | ICD-10-CM

## 2023-07-18 NOTE — Telephone Encounter (Signed)
Lvmtcb and schedule

## 2023-07-18 NOTE — Telephone Encounter (Signed)
Patient over due for a DM follow up can we get him scheduled withing the next 30 days

## 2023-11-06 ENCOUNTER — Other Ambulatory Visit: Payer: Self-pay | Admitting: Nurse Practitioner

## 2023-11-06 DIAGNOSIS — E1165 Type 2 diabetes mellitus with hyperglycemia: Secondary | ICD-10-CM

## 2023-11-11 ENCOUNTER — Ambulatory Visit (INDEPENDENT_AMBULATORY_CARE_PROVIDER_SITE_OTHER): Payer: BC Managed Care – PPO | Admitting: Nurse Practitioner

## 2023-11-11 ENCOUNTER — Encounter: Payer: Self-pay | Admitting: Nurse Practitioner

## 2023-11-11 VITALS — BP 134/88 | HR 90 | Temp 97.6°F | Ht 68.0 in | Wt 172.2 lb

## 2023-11-11 DIAGNOSIS — E785 Hyperlipidemia, unspecified: Secondary | ICD-10-CM

## 2023-11-11 DIAGNOSIS — Z7985 Long-term (current) use of injectable non-insulin antidiabetic drugs: Secondary | ICD-10-CM

## 2023-11-11 DIAGNOSIS — E1165 Type 2 diabetes mellitus with hyperglycemia: Secondary | ICD-10-CM | POA: Diagnosis not present

## 2023-11-11 DIAGNOSIS — E1159 Type 2 diabetes mellitus with other circulatory complications: Secondary | ICD-10-CM

## 2023-11-11 DIAGNOSIS — E1169 Type 2 diabetes mellitus with other specified complication: Secondary | ICD-10-CM | POA: Diagnosis not present

## 2023-11-11 DIAGNOSIS — I152 Hypertension secondary to endocrine disorders: Secondary | ICD-10-CM

## 2023-11-11 DIAGNOSIS — Z7984 Long term (current) use of oral hypoglycemic drugs: Secondary | ICD-10-CM

## 2023-11-11 LAB — LIPID PANEL
Cholesterol: 228 mg/dL — ABNORMAL HIGH (ref 0–200)
HDL: 52.4 mg/dL (ref >39.00)
LDL Cholesterol: 133 mg/dL — ABNORMAL HIGH (ref 0–99)
NonHDL: 175.95
Total CHOL/HDL Ratio: 4
Triglycerides: 217 mg/dL — ABNORMAL HIGH (ref 0.0–149.0)
VLDL: 43.4 mg/dL — ABNORMAL HIGH (ref 0.0–40.0)

## 2023-11-11 LAB — COMPREHENSIVE METABOLIC PANEL
ALT: 77 U/L — ABNORMAL HIGH (ref 0–53)
AST: 35 U/L (ref 0–37)
Albumin: 4.3 g/dL (ref 3.5–5.2)
Alkaline Phosphatase: 157 U/L — ABNORMAL HIGH (ref 39–117)
BUN: 15 mg/dL (ref 6–23)
CO2: 32 meq/L (ref 19–32)
Calcium: 9.2 mg/dL (ref 8.4–10.5)
Chloride: 96 meq/L (ref 96–112)
Creatinine, Ser: 0.63 mg/dL (ref 0.40–1.50)
GFR: 125.93 mL/min (ref >60.00)
Glucose, Bld: 299 mg/dL — ABNORMAL HIGH (ref 70–99)
Potassium: 3.9 meq/L (ref 3.5–5.1)
Sodium: 135 meq/L (ref 135–145)
Total Bilirubin: 1.1 mg/dL (ref 0.2–1.2)
Total Protein: 7.4 g/dL (ref 6.0–8.3)

## 2023-11-11 LAB — POCT GLYCOSYLATED HEMOGLOBIN (HGB A1C): Hemoglobin A1C: 12.4 % — AB (ref 4.0–5.6)

## 2023-11-11 LAB — MICROALBUMIN / CREATININE URINE RATIO
Creatinine,U: 86 mg/dL
Microalb Creat Ratio: 1.2 mg/g (ref 0.0–30.0)
Microalb, Ur: 1 mg/dL (ref 0.0–1.9)

## 2023-11-11 LAB — CBC
HCT: 46.2 % (ref 39.0–52.0)
Hemoglobin: 15.6 g/dL (ref 13.0–17.0)
MCHC: 33.8 g/dL (ref 30.0–36.0)
MCV: 88.9 fL (ref 78.0–100.0)
Platelets: 215 10*3/uL (ref 150.0–400.0)
RBC: 5.19 Mil/uL (ref 4.22–5.81)
RDW: 13.2 % (ref 11.5–15.5)
WBC: 6.1 10*3/uL (ref 4.0–10.5)

## 2023-11-11 MED ORDER — LISINOPRIL 10 MG PO TABS: 10.0000 mg | ORAL_TABLET | Freq: Every day | ORAL | 3 refills | Status: AC

## 2023-11-11 MED ORDER — METFORMIN HCL ER 500 MG PO TB24: ORAL_TABLET | ORAL | 0 refills | Status: DC

## 2023-11-11 MED ORDER — OZEMPIC (0.25 OR 0.5 MG/DOSE) 2 MG/3ML ~~LOC~~ SOPN: 0.2500 mg | PEN_INJECTOR | SUBCUTANEOUS | 0 refills | Status: DC

## 2023-11-11 MED ORDER — GLIPIZIDE 10 MG PO TABS: 10.0000 mg | ORAL_TABLET | Freq: Two times a day (BID) | ORAL | 3 refills | Status: AC

## 2023-11-11 NOTE — Assessment & Plan Note (Signed)
 Uncontrolled A1c of 12.4% today.  Patient has not been adherent to medications follow-ups in the past.  Will restart patient on glipizide  10 mg twice daily metformin  500 mg titrating up to 1000 mg twice daily.  Will also restart Ozempic  0.25 mg.  In the past patient was on glipizide  10 mg twice daily metformin  1000 mg twice daily and semaglutide  2 mg weekly.

## 2023-11-11 NOTE — Progress Notes (Signed)
 Established Patient Office Visit  Subjective   Patient ID: Sean Gonzalez, male    DOB: 1991-05-28  Age: 33 y.o. MRN: [id number]  Chief Complaint  Patient presents with   Diabetes    Has scheduled diabetic eye exam for this month.       DM2: patient was last seen by me on 01/13/2023. He is suppose to be on glipizide , metformin , and ozempic . States that he has had a lot of life stuff happening since last office visit. States that he bought a home and moved to the town over.  States that he is still taking the glipizide  once a day. States that he is doing metformin  once a dya but it does bother his stomach  Does not check his sugars at home. Does have a supply  Diet: he is doing 3 meals a day. States snacks are jerky and fruits.  Drinks mostly water, sometimes soft drink and sweet tea.   He has been having polydipsia and polyuria (at night)    Review of Systems  Constitutional:  Negative for chills and fever.  Respiratory:  Negative for shortness of breath.   Cardiovascular:  Negative for chest pain.  Gastrointestinal:  Positive for diarrhea. Negative for abdominal pain, constipation, nausea and vomiting.  Neurological:  Negative for headaches.  Psychiatric/Behavioral:  Negative for hallucinations and suicidal ideas.       Objective:     BP 134/88   Pulse 90   Temp 97.6 F (36.4 C) (Oral)   Ht 5' 8 (1.727 m)   Wt 172 lb 3.2 oz (78.1 kg)   SpO2 98%   BMI 26.18 kg/m  BP Readings from Last 3 Encounters:  11/11/23 134/88  02/02/23 (!) 145/95  01/13/23 128/80   Wt Readings from Last 3 Encounters:  11/11/23 172 lb 3.2 oz (78.1 kg)  01/13/23 174 lb 4 oz (79 kg)  12/09/22 175 lb 2 oz (79.4 kg)   SpO2 Readings from Last 3 Encounters:  11/11/23 98%  01/13/23 98%  12/09/22 99%      Physical Exam Vitals and nursing note reviewed.  Constitutional:      Appearance: Normal appearance.  Cardiovascular:     Rate and Rhythm: Normal rate and regular rhythm.      Heart sounds: Normal heart sounds.  Pulmonary:     Effort: Pulmonary effort is normal.     Breath sounds: Normal breath sounds.  Abdominal:     General: Bowel sounds are normal. There is no distension.     Palpations: There is no mass.     Tenderness: There is no abdominal tenderness.     Hernia: No hernia is present.  Neurological:     Mental Status: He is alert.     Title   Diabetic Foot Exam - detailed Date & Time: 11/11/2023  9:38 AM Diabetic Foot exam was performed with the following findings: Yes  Is there a history of foot ulcer?: No Is there a foot ulcer now?: No Is there swelling?: No Is there elevated skin temperature?: No Is there abnormal foot shape?: No Is there a claw toe deformity?: No Are the toenails long?: No Are the toenails thick?: No Are the toenails ingrown?: No Pulse Foot Exam completed.: Yes   Right Posterior Tibialis: Present Left posterior Tibialis: Present   Right Dorsalis Pedis: Present Left Dorsalis Pedis: Present     Sensory Foot Exam Completed.: Yes Semmes-Weinstein Monofilament Test + means has sensation and - means no sensation  Image components are not supported.   Image components are not supported. Image components are not supported.  T[place] Comments All 10 sites tested sensation is intact bilaterally     Results for orders placed or performed in visit on 11/11/23  POCT glycosylated hemoglobin (Hb A1C)  Result Value Ref Range   Hemoglobin A1C 12.4 (A) 4.0 - 5.6 %   HbA1c POC (<> result, manual entry)     HbA1c, POC (prediabetic range)     HbA1c, POC (controlled diabetic range)        The ASCVD Risk score (Arnett DK, et al., 2019) failed to calculate for the following reasons:   The 2019 ASCVD risk score is only valid for ages 46 to 65    Assessment & Plan:   Problem List Items Addressed This Visit       Cardiovascular and Mediastinum   Hypertension associated with diabetes [facility])   Patient currently  maintained on lisinopril  10 mg daily.  Refill provided today continue medication for blood pressure and renal protection in the setting of uncontrolled diabetes.      Relevant Medications   glipiZIDE  (GLUCOTROL ) 10 MG tablet   lisinopril  (ZESTRIL ) 10 MG tablet   metFORMIN  (GLUCOPHAGE -XR) 500 MG 24 hr tablet   Semaglutide ,0.25 or 0.5MG /DOS, (OZEMPIC , 0.25 OR 0.5 MG/DOSE,) 2 MG/3ML SOPN   Other Relevant Orders   CBC   Comprehensive metabolic panel     Endocrine   Hyperlipidemia associated with type 2 diabetes mellitus (HCC)   History of the same pending lipid panel today      Relevant Medications   glipiZIDE  (GLUCOTROL ) 10 MG tablet   lisinopril  (ZESTRIL ) 10 MG tablet   metFORMIN  (GLUCOPHAGE -XR) 500 MG 24 hr tablet   Semaglutide ,0.25 or 0.5MG /DOS, (OZEMPIC , 0.25 OR 0.5 MG/DOSE,) 2 MG/3ML SOPN   Other Relevant Orders   Lipid panel   Uncontrolled type 2 diabetes mellitus with hyperglycemia (HCC) - Primary   Uncontrolled A1c of 12.4% today.  Patient has not been adherent to medications follow-ups in the past.  Will restart patient on glipizide  10 mg twice daily metformin  500 mg titrating up to 1000 mg twice daily.  Will also restart Ozempic  0.25 mg.  In the past patient was on glipizide  10 mg twice daily metformin  1000 mg twice daily and semaglutide  2 mg weekly.      Relevant Medications   glipiZIDE  (GLUCOTROL ) 10 MG tablet   lisinopril  (ZESTRIL ) 10 MG tablet   metFORMIN  (GLUCOPHAGE -XR) 500 MG 24 hr tablet   Semaglutide ,0.25 or 0.5MG /DOS, (OZEMPIC , 0.25 OR 0.5 MG/DOSE,) 2 MG/3ML SOPN   Other Relevant Orders   POCT glycosylated hemoglobin (Hb A1C) (Completed)   CBC   Comprehensive metabolic panel   Lipid panel   Microalbumin / creatinine urine ratio    Return in about 3 months (around 02/09/2024) for DM recheck.    Adina Crandall, NP

## 2023-11-11 NOTE — Assessment & Plan Note (Signed)
 Patient currently maintained on lisinopril 10 mg daily.  Refill provided today continue medication for blood pressure and renal protection in the setting of uncontrolled diabetes.

## 2023-11-11 NOTE — Patient Instructions (Signed)
 500mg  (1 tab) in the morning for 1 week, then 500mg  (1 tab) twice a day for a week, then 1000mg  (2 tabs) in the morning and 500mg  (1 tab) in the evening for a week, then 1000mg  (2 tabs) twice a week  I will be in touch with the labs once I have them Follow up with me in 3 months, sooner if you need me

## 2023-11-11 NOTE — Assessment & Plan Note (Signed)
 History of the same pending lipid panel today

## 2023-11-28 ENCOUNTER — Encounter: Payer: Self-pay | Admitting: Nurse Practitioner

## 2023-12-04 ENCOUNTER — Other Ambulatory Visit: Payer: Self-pay | Admitting: Nurse Practitioner

## 2023-12-04 DIAGNOSIS — E1165 Type 2 diabetes mellitus with hyperglycemia: Secondary | ICD-10-CM

## 2023-12-06 ENCOUNTER — Other Ambulatory Visit: Payer: Self-pay | Admitting: Nurse Practitioner

## 2023-12-06 DIAGNOSIS — E1165 Type 2 diabetes mellitus with hyperglycemia: Secondary | ICD-10-CM

## 2024-02-09 ENCOUNTER — Ambulatory Visit: Payer: BC Managed Care – PPO | Admitting: Nurse Practitioner

## 2024-03-29 ENCOUNTER — Ambulatory Visit: Admitting: Nurse Practitioner

## 2024-04-19 ENCOUNTER — Ambulatory Visit: Admitting: Nurse Practitioner

## 2024-04-19 NOTE — Progress Notes (Deleted)
   Established Patient Office Visit  Subjective   Patient ID: Sean Gonzalez, male    DOB: October 24, 1991  Age: 33 y.o. MRN: [id number]  No chief complaint on file.   HPI  HTN: Currently maintained on lisinopril  10 mg daily  DM2: Patient currently maintained on metformin  1000 mg twice daily, Ozempic  0.5 mg weekly, glipizide  10 mg twice daily.  Patient's last A1c was 12.4.  This is in the setting of patient being nonadherent to medications and follow-ups in the past he was restarted on his therapy at that point glipizide  10 mg twice daily metformin  titration up to 1000 mg twice daily and restarted Ozempic .  The past patient was on Ozempic  2 mg along with other therapy  {History (Optional):23778}  ROS    Objective:     There were no vitals taken for this visit. {Vitals History (Optional):23777}  Physical Exam   No results found for any visits on 04/19/24.  {Labs (Optional):23779}  The ASCVD Risk score (Arnett DK, et al., 2019) failed to calculate for the following reasons:   The 2019 ASCVD risk score is only valid for ages 45 to 47    Assessment & Plan:   Problem List Items Addressed This Visit   None   No follow-ups on file.    Margarie Shay, NP

## 2024-05-10 ENCOUNTER — Ambulatory Visit: Admitting: Nurse Practitioner

## 2024-05-21 ENCOUNTER — Encounter: Payer: Self-pay | Admitting: Nurse Practitioner

## 2024-05-23 NOTE — Telephone Encounter (Signed)
 Needs an office visit for referral plus he needs to be seen for his DM. We can do this at the sametime

## 2024-05-31 ENCOUNTER — Ambulatory Visit: Admitting: Nurse Practitioner

## 2024-05-31 NOTE — Progress Notes (Deleted)
   Established Patient Office Visit  Subjective   Patient ID: Sean Gonzalez, male    DOB: 04/06/1991  Age: 33 y.o. MRN: [id number]  No chief complaint on file.   HPI  DM2: Patient currently maintained on glipizide  10 mg twice daily, metformin  1000 mg twice daily, Ozempic  0.5 mg once a week  HTN: Patient currently maintained on lisinopril  10 mg daily  Fertility concerns: Patient was referred to urology on 07/27/2022 for concerns for abnormal sperm count patient was evaluated on 08/12/2022 by Dr. Glendia Barba.  Clinically on exam and imaging ejaculatory duct obstruction was ordered.  CT pelvis shows no abnormalities and neurology recommended some specialty referral/further evaluation.  {History (Optional):23778}  ROS    Objective:     There were no vitals taken for this visit. {Vitals History (Optional):23777}  Physical Exam   No results found for any visits on 05/31/24.  {Labs (Optional):23779}  The ASCVD Risk score (Arnett DK, et al., 2019) failed to calculate for the following reasons:   The 2019 ASCVD risk score is only valid for ages 35 to 14    Assessment & Plan:   Problem List Items Addressed This Visit   None   No follow-ups on file.    Adina Crandall, NP

## 2024-05-31 NOTE — Telephone Encounter (Signed)
 Patient would like to be seen prior to Matt's first available for a referral.  He is scheduled with Mallie Gaskins  for 8/1 to discuss referral

## 2024-06-01 ENCOUNTER — Ambulatory Visit: Payer: Self-pay | Admitting: Primary Care

## 2024-06-01 ENCOUNTER — Encounter: Payer: Self-pay | Admitting: Primary Care

## 2024-06-01 ENCOUNTER — Ambulatory Visit: Admitting: Primary Care

## 2024-06-01 VITALS — BP 124/82 | HR 83 | Temp 97.2°F | Ht 68.0 in | Wt 172.0 lb

## 2024-06-01 DIAGNOSIS — R869 Unspecified abnormal finding in specimens from male genital organs: Secondary | ICD-10-CM | POA: Diagnosis not present

## 2024-06-01 DIAGNOSIS — E1165 Type 2 diabetes mellitus with hyperglycemia: Secondary | ICD-10-CM | POA: Diagnosis not present

## 2024-06-01 LAB — POCT GLYCOSYLATED HEMOGLOBIN (HGB A1C): Hemoglobin A1C: 10.6 % — AB (ref 4.0–5.6)

## 2024-06-01 MED ORDER — SEMAGLUTIDE (1 MG/DOSE) 4 MG/3ML ~~LOC~~ SOPN
1.0000 mg | PEN_INJECTOR | SUBCUTANEOUS | 0 refills | Status: DC
  Filled 2024-06-04: qty 3, 28d supply, fill #0

## 2024-06-01 MED ORDER — LANCETS MISC: 0 refills | Status: AC

## 2024-06-01 MED ORDER — GLUCOSE BLOOD VI STRP: ORAL_STRIP | 0 refills | Status: DC

## 2024-06-01 NOTE — Assessment & Plan Note (Signed)
 Long discussion today regarding contributors to infertility including his uncontrolled diabetes. Reviewed labs and office notes from urology from 2023.  New referral placed back to urology for further evaluation per patient request. We also discussed fertility specialist.

## 2024-06-01 NOTE — Progress Notes (Signed)
 Subjective:    Patient ID: Sean Gonzalez, male    DOB: 05-22-1991, 33 y.o.   MRN: [id number]  HPI  Sean Gonzalez is a very pleasant 33 y.o. male with history of hypertension, type 2 diabetes, hyperlipidemia who presents today to discuss infertility and diabetes.   Previously following with [facility], Dr. Twylla for abnormal ejaculation with decreased semen volume.  Last office visit was in October 2023.  He underwent CT pelvis in 2020 for which was negative for seminal vesicle cysts or other significant abnormality.  Lab work including LH and testosterone  which were normal. His testosterone  level was borderline low. Recommendation was for subspecialty referral for further evaluation of his ejaculatory dysfunction.   He and his wife have been trying to conceive for pregnancy off and on over the years. Over the last 6 months they have been very focused on trying to conceive without success.   His wife is not seeing a fertility specialist.   His A1C was 12.4 in January 2025. At the time he was inconsistent with medication compliance. He's been cutting back on soda and is working on his diet.  He is compliant to metformin  1000 mg BID, glipizide  10 mg BID, and Ozempic  0.5 mg weekly for the most part. He has missed a few doses over the months but is better about compliance. He does not check glucose levels, he does have a meter. He needs refills on his test strips and lancets.   BP Readings from Last 3 Encounters:  06/01/24 124/82  11/11/23 134/88  02/02/23 (!) 145/95   Wt Readings from Last 3 Encounters:  06/01/24 172 lb (78 kg)  11/11/23 172 lb 3.2 oz (78.1 kg)  01/13/23 174 lb 4 oz (79 kg)     Review of Systems  Respiratory:  Negative for shortness of breath.   Cardiovascular:  Negative for chest pain.  Endocrine: Negative for polydipsia, polyphagia and polyuria.  Genitourinary:        Little semen with ejaculation   Neurological:  Negative for numbness.          Past Medical History:  Diagnosis Date   Diabetes mellitus, new onset (HCC) 11/27/2018   Elevated liver function tests 11/27/2018   Hepatic steatosis 03/19/2019   Mild per [place]     Social History   Socioeconomic History   Marital status: Single    Spouse name: Not on file   Number of children: 0   Years of education: Not on file   Highest education level: Not on file  Occupational History   Occupation: Customer Service  Tobacco Use   Smoking status: Never   Smokeless tobacco: Never  Vaping Use   Vaping status: Never Used  Substance and Sexual Activity   Alcohol use: Not Currently   Drug use: Never   Sexual activity: Yes    Partners: Female  Other Topics Concern   Not on file  Social History Narrative   Not on file   Social Drivers of Health   Financial Resource Strain: Not on file  Food Insecurity: Not on file  Transportation Needs: Not on file  Physical Activity: Not on file  Stress: Not on file  Social Connections: Not on file  Intimate Partner Violence: Not on file    Past Surgical History:  Procedure Laterality Date   NO PAST SURGERIES      Family History  Problem Relation Age of Onset   Diabetes Mother    Stomach cancer Neg Hx  Colon cancer Neg Hx    Pancreatic cancer Neg Hx     No Known Allergies  Current Outpatient Medications on File Prior to Visit  Medication Sig Dispense Refill   glipiZIDE  (GLUCOTROL ) 10 MG tablet Take 1 tablet (10 mg total) by mouth 2 (two) times daily before a meal. 180 tablet 3   lisinopril  (ZESTRIL ) 10 MG tablet Take 1 tablet (10 mg total) by mouth daily. 90 tablet 3   metFORMIN  (GLUCOPHAGE -XR) 500 MG 24 hr tablet Take 2 tablets (1,000 mg total) by mouth 2 (two) times daily with a meal. TAKE 1 TABLET EVERY MORNING FOR ONE WEEK, TAKE 2 TABLETS EVERY MORNING AND TAKE 1 TABLET EVERY EVENING FOR ONE WEEK, THEN TAKE 2 TABLETS TWICE A DAY 360 tablet 0   mometasone  (ELOCON ) 0.1 % ointment Apply to aa's rash BID PRN. Avoid  applying to face, groin, and axilla. Use as directed. Long-term use can cause thinning of the skin. (Patient not taking: Reported on 06/01/2024) 45 g 0   Ruxolitinib Phosphate  (OPZELURA ) 1.5 % CREA Apply to aa's eczema BID PRN. (Patient not taking: Reported on 06/01/2024) 60 g 5   No current facility-administered medications on file prior to visit.    BP 124/82   Pulse 83   Temp (!) 97.2 F (36.2 C) (Temporal)   Ht 5' 8 (1.727 m)   Wt 172 lb (78 kg)   SpO2 99%   BMI 26.15 kg/m  Objective:   Physical Exam Cardiovascular:     Rate and Rhythm: Normal rate and regular rhythm.  Pulmonary:     Effort: Pulmonary effort is normal.     Breath sounds: Normal breath sounds.  Musculoskeletal:     Cervical back: Neck supple.  Skin:    General: Skin is warm and dry.  Neurological:     Mental Status: He is alert and oriented to person, place, and time.  Psychiatric:        Mood and Affect: Mood normal.           Assessment & Plan:  Semen abnormal Assessment & Plan: Long discussion today regarding contributors to infertility including his uncontrolled diabetes. Reviewed labs and office notes from urology from 2023.  New referral placed back to urology for further evaluation per patient request. We also discussed fertility specialist.  Orders: -     Ambulatory referral to Urology  Uncontrolled type 2 diabetes mellitus with hyperglycemia (HCC) Assessment & Plan: Slight improvement and remains uncontrolled with A1c of 10.6 today.  We discussed the importance of compliance to his diabetes regimen. Refills provided for test trips and lancets.  Discussed the importance of testing glucose levels regularly.  Increase Ozempic  to 1 mg weekly. Continue metformin  ER 1000 mg twice daily, glipizide  10 mg twice daily.  Follow up with PCP as scheduled.  Orders: -     POCT glycosylated hemoglobin (Hb A1C) -     Glucose Blood; Use to check blood sugars 1-3 times daily  Dispense: 300 each;  Refill: 0 -     Lancets; Use to check blood sugar 1-3 times daily.  Dispense: 300 each; Refill: 0 -     Semaglutide  (1 MG/DOSE); Inject 1 mg as directed once a week. for diabetes.  Dispense: 3 mL; Refill: 0        Comer MARLA Gaskins, NP

## 2024-06-01 NOTE — Assessment & Plan Note (Signed)
 Slight improvement and remains uncontrolled with A1c of 10.6 today.  We discussed the importance of compliance to his diabetes regimen. Refills provided for test trips and lancets.  Discussed the importance of testing glucose levels regularly.  Increase Ozempic  to 1 mg weekly. Continue metformin  ER 1000 mg twice daily, glipizide  10 mg twice daily.  Follow up with PCP as scheduled.

## 2024-06-04 ENCOUNTER — Other Ambulatory Visit: Payer: Self-pay | Admitting: Primary Care

## 2024-06-04 ENCOUNTER — Other Ambulatory Visit: Payer: Self-pay

## 2024-06-04 DIAGNOSIS — E1165 Type 2 diabetes mellitus with hyperglycemia: Secondary | ICD-10-CM

## 2024-06-26 ENCOUNTER — Ambulatory Visit: Admitting: Urology

## 2024-06-26 VITALS — BP 124/84 | HR 77 | Ht 68.0 in | Wt 170.0 lb

## 2024-06-26 DIAGNOSIS — R868 Other abnormal findings in specimens from male genital organs: Secondary | ICD-10-CM | POA: Diagnosis not present

## 2024-06-26 DIAGNOSIS — N469 Male infertility, unspecified: Secondary | ICD-10-CM | POA: Diagnosis not present

## 2024-06-26 NOTE — Progress Notes (Signed)
 06/26/2024 9:00 AM   Ami JINNY Lipps 1991-09-21 [id number]  Referring provider: Gretta Comer POUR, NP [address] [place],  [place] [address]  No chief complaint on file.   HPI: Sean Gonzalez is a 33 y.o. male referred for abnormal semen  I previously saw him October 2023 for decreased semen volume. Evaluation included testosterone  level which was 300, normal LH.  Pelvic CT showed no cystic lesions of the seminal vesicles He and his fiance have been trying to conceive for the last 1.5 years without success.  [place] has no previous pregnancies or children Semen volume without change   PMH: Past Medical History:  Diagnosis Date   Diabetes mellitus, new onset (HCC) 11/27/2018   Elevated liver function tests 11/27/2018   Hepatic steatosis 03/19/2019   Mild per [place]     Surgical History: Past Surgical History:  Procedure Laterality Date   NO PAST SURGERIES      Home Medications:  Allergies as of 06/26/2024   No Known Allergies      Medication List        Accurate as of June 26, 2024  9:00 AM. If you have any questions, ask your nurse or doctor.          Accu-Chek Guide Test test strip Generic drug: glucose blood 1 each by Other route 2 (two) times daily.   glipiZIDE  10 MG tablet Commonly known as: GLUCOTROL  Take 1 tablet (10 mg total) by mouth 2 (two) times daily before a meal.   Lancets Misc Use to check blood sugar 1-3 times daily.   lisinopril  10 MG tablet Commonly known as: ZESTRIL  Take 1 tablet (10 mg total) by mouth daily.   metFORMIN  500 MG 24 hr tablet Commonly known as: GLUCOPHAGE -XR Take 2 tablets (1,000 mg total) by mouth 2 (two) times daily with a meal. TAKE 1 TABLET EVERY MORNING FOR ONE WEEK, TAKE 2 TABLETS EVERY MORNING AND TAKE 1 TABLET EVERY EVENING FOR ONE WEEK, THEN TAKE 2 TABLETS TWICE A DAY   mometasone  0.1 % ointment Commonly known as: ELOCON  Apply to aa's rash BID PRN. Avoid applying to face, groin, and axilla. Use as  directed. Long-term use can cause thinning of the skin.   Opzelura  1.5 % Crea Generic drug: Ruxolitinib Phosphate  Apply to aa's eczema BID PRN.   Semaglutide  (1 MG/DOSE) 4 MG/3ML Sopn Inject 1 mg as directed once a week. for diabetes.        Allergies: No Known Allergies  Family History: Family History  Problem Relation Age of Onset   Diabetes Mother    Stomach cancer Neg Hx    Colon cancer Neg Hx    Pancreatic cancer Neg Hx     Social History:  reports that he has never smoked. He has never used smokeless tobacco. He reports that he does not currently use alcohol. He reports that he does not use drugs.   Physical Exam: BP 124/84   Pulse 77   Ht 5' 8 (1.727 m)   Wt 170 lb (77.1 kg)   BMI 25.85 kg/m   Constitutional:  Alert, No acute distress. HEENT: [place] AT Respiratory: Normal respiratory effort, no increased work of breathing. Psychiatric: Normal mood and affect.    Assessment & Plan:    1.  Low semen volume Ordered basic semen analysis Referral to Dr. Lovie in [facility] for further evaluation   Glendia JAYSON Barba, MD  [facility] [place] [address]  [place], [place] [address] [phone]

## 2024-07-13 ENCOUNTER — Ambulatory Visit: Admitting: Nurse Practitioner

## 2024-08-02 ENCOUNTER — Ambulatory Visit: Admitting: Nurse Practitioner

## 2024-09-07 NOTE — Progress Notes (Signed)
 ANTAEUS KAREL                                          MRN: [id number]   09/07/2024   The [facility] Quality Team Specialist reviewed this patient medical record for the purposes of chart review for care gap closure. The following were reviewed: chart review for care gap closure-glycemic status assessment.    [facility] Quality Team

## 2024-09-20 ENCOUNTER — Ambulatory Visit: Admitting: Nurse Practitioner

## 2024-10-07 ENCOUNTER — Other Ambulatory Visit: Payer: Self-pay | Admitting: Primary Care

## 2024-10-07 DIAGNOSIS — E1165 Type 2 diabetes mellitus with hyperglycemia: Secondary | ICD-10-CM

## 2024-11-08 ENCOUNTER — Ambulatory Visit: Admitting: Nurse Practitioner

## 2024-12-13 ENCOUNTER — Ambulatory Visit: Admitting: Nurse Practitioner
